# Patient Record
Sex: Female | Born: 1985 | Hispanic: Yes | Smoking: Never smoker
Health system: Southern US, Community
[De-identification: ages and names within clinical notes are randomized; demographics above are authoritative.]

## PROBLEM LIST (undated history)

## (undated) HISTORY — PX: BREAST BIOPSY: SHX20

## (undated) HISTORY — PX: APPENDECTOMY: SHX54

---

## 2019-10-11 NOTE — L&D Delivery Note (Signed)
Delivery Note Pt progressed without augmentation to C/C/+3.  After a 10 minute 2nd stage, at 9:53 PM a viable female was delivered via Vaginal, Spontaneous (Presentation: Left Occiput Anterior).  APGAR: 8, 9; weight pending.  After 1 minute, the cord was clamped and cut. 40 units of pitocin diluted in 1000cc LR was infused rapidly IV.  The placenta separated spontaneously and delivered via CCT and maternal pushing effort.  It was inspected and appears to be intact with a 3 VC  Anesthesia: Epidural Episiotomy: None Lacerations: 1st degree Suture Repair: 2.0 vicryl   (oozing) Est. Blood Loss (mL): 100  Mom to postpartum.  Baby to Couplet care / Skin to Skin.  Joaquim Lai Cresenzo-Dishmon 10/05/2020, 10:50 PM

## 2020-06-23 ENCOUNTER — Encounter: Payer: Self-pay | Admitting: *Deleted

## 2020-07-07 ENCOUNTER — Ambulatory Visit (INDEPENDENT_AMBULATORY_CARE_PROVIDER_SITE_OTHER): Payer: Self-pay | Admitting: Certified Nurse Midwife

## 2020-07-07 ENCOUNTER — Encounter: Payer: Self-pay | Admitting: *Deleted

## 2020-07-07 ENCOUNTER — Encounter: Payer: Self-pay | Admitting: Certified Nurse Midwife

## 2020-07-07 ENCOUNTER — Other Ambulatory Visit: Payer: Self-pay

## 2020-07-07 VITALS — BP 115/80 | HR 96 | Ht 62.2 in | Wt 164.3 lb

## 2020-07-07 DIAGNOSIS — O99612 Diseases of the digestive system complicating pregnancy, second trimester: Secondary | ICD-10-CM

## 2020-07-07 DIAGNOSIS — Z789 Other specified health status: Secondary | ICD-10-CM

## 2020-07-07 DIAGNOSIS — O0930 Supervision of pregnancy with insufficient antenatal care, unspecified trimester: Secondary | ICD-10-CM | POA: Insufficient documentation

## 2020-07-07 DIAGNOSIS — Z3490 Encounter for supervision of normal pregnancy, unspecified, unspecified trimester: Secondary | ICD-10-CM | POA: Insufficient documentation

## 2020-07-07 DIAGNOSIS — K219 Gastro-esophageal reflux disease without esophagitis: Secondary | ICD-10-CM

## 2020-07-07 DIAGNOSIS — Z3A25 25 weeks gestation of pregnancy: Secondary | ICD-10-CM

## 2020-07-07 LAB — POCT URINALYSIS DIP (DEVICE)
Bilirubin Urine: NEGATIVE
Glucose, UA: NEGATIVE mg/dL
Hgb urine dipstick: NEGATIVE
Ketones, ur: NEGATIVE mg/dL
Leukocytes,Ua: NEGATIVE
Nitrite: NEGATIVE
Protein, ur: NEGATIVE mg/dL
Specific Gravity, Urine: <=1.005 (ref 1.005–1.030)
Urobilinogen, UA: 0.2 mg/dL (ref 0.0–1.0)
pH: 5.5 (ref 5.0–8.0)

## 2020-07-07 MED ORDER — FAMOTIDINE 20 MG PO TABS: 20.0000 mg | ORAL_TABLET | Freq: Two times a day (BID) | ORAL | 3 refills | Status: DC

## 2020-07-07 NOTE — Progress Notes (Signed)
Here for new ob visit with [employer] interpreter. Given new patient prenatal booklets. Given bp cuff with insructions.  Crucita Lacorte,RN

## 2020-07-07 NOTE — Progress Notes (Signed)
   PRENATAL VISIT NOTE  Subjective:  Veronica Chapman is a 34 y.o. G2P1000 at [redacted]w[redacted]d being seen today for her first prenatal visit for this pregnancy.  She is currently monitored for the following issues for this low-risk pregnancy and has Supervision of low-risk pregnancy; Late prenatal care affecting pregnancy; and Language barrier on their problem list.  Patient reports heartburn.  Contractions: Not present. Vag. Bleeding: None.  Movement: Present. Denies leaking of fluid.   She is planning to both breast and bottle feed. She did so with her last child for two years. Desires Depo-Provera for contraception.   The following portions of the patient's history were reviewed and updated as appropriate: allergies, current medications, past family history, past medical history, past social history, past surgical history and problem list.   Objective:   Vitals:   07/07/20 1420 07/07/20 1423  BP: 115/80   Pulse: 96   Weight: 164 lb 4.8 oz (74.5 kg)   Height:  5' 2.2" (1.58 m)    Fetal Status: Fetal Heart Rate (bpm): 152 Fundal Height: 25 cm Movement: Present     General:  Alert, oriented and cooperative. Patient is in no acute distress.  Skin: Skin is warm and dry. No rash noted.   Cardiovascular: Normal heart rate and rhythm noted  Respiratory: Normal respiratory effort, no problems with respiration noted. Clear to auscultation.   Abdomen: Soft, gravid, appropriate for gestational age. Normal bowel sounds. Non-tender. Pain/Pressure: Absent     Pelvic: Cervical exam deferred       Normal cervical contour, no lesions, no bleeding following pap, normal discharge  Extremities: Normal range of motion.  Edema: None  Mental Status: Normal mood and affect. Normal behavior. Normal judgment and thought content.   Assessment and Plan:  Pregnancy: G2P1000 at [redacted]w[redacted]d 1. Encounter for supervision of low-risk pregnancy, antepartum - CBC/D/Plt+RPR+Rh+ABO+Rub Ab... - Culture, OB Urine - Hemoglobin  A1c - Genetic Screening - Last pap in [place], Sept 2019, was normal - No vaginal discharge or discomfort, no contractions so swabs deferred until 36 weeks.  2. Late prenatal care affecting pregnancy, antepartum - [employer], [facility] - Discussed the normal visit cadence for prenatal care - Discussed the nature of our practice with multiple providers including residents and students   3. Language barrier - [employer] interpreter present for appointment  4. 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 Scar did not trouble her during her last pregnancy. - Reviewed ways to ease discomfort if it begins to pull or tingle as her abdomen stretches  5. Gastroesophageal reflux during pregnancy in second trimester, antepartum - famotidine (PEPCID) 20 MG tablet; Take 1 tablet (20 mg total) by mouth 2 (two) times daily.  Dispense: 60 tablet; Refill: 3  Preterm labor/first trimester warning symptoms and general obstetric precautions including but not limited to vaginal bleeding, contractions, leaking of fluid and fetal movement were reviewed in detail with the patient. Please refer to After Visit Summary for other counseling recommendations.   Return today (on 07/07/2020) for LOB w GTT.  Gaylan Gerold, CNM, MSN, [facility] 07/07/20 3:49 PM

## 2020-07-09 LAB — CBC/D/PLT+RPR+RH+ABO+RUB AB...
Antibody Screen: NEGATIVE
Basophils Absolute: 0 10*3/uL (ref 0.0–0.2)
Basos: 0 %
EOS (ABSOLUTE): 0 10*3/uL (ref 0.0–0.4)
Eos: 0 %
HCV Ab: <0.1 s/co ratio (ref 0.0–0.9)
HIV Screen 4th Generation wRfx: NONREACTIVE
Hematocrit: 38.1 % (ref 34.0–46.6)
Hemoglobin: 12.5 g/dL (ref 11.1–15.9)
Hepatitis B Surface Ag: NEGATIVE
Immature Grans (Abs): 0.1 10*3/uL (ref 0.0–0.1)
Immature Granulocytes: 1 %
Lymphocytes Absolute: 2 10*3/uL (ref 0.7–3.1)
Lymphs: 21 %
MCH: 26.1 pg — ABNORMAL LOW (ref 26.6–33.0)
MCHC: 32.8 g/dL (ref 31.5–35.7)
MCV: 80 fL (ref 79–97)
Monocytes Absolute: 0.7 10*3/uL (ref 0.1–0.9)
Monocytes: 7 %
Neutrophils Absolute: 7 10*3/uL (ref 1.4–7.0)
Neutrophils: 71 %
Platelets: 318 10*3/uL (ref 150–450)
RBC: 4.79 x10E6/uL (ref 3.77–5.28)
RDW: 13.3 % (ref 11.7–15.4)
RPR Ser Ql: NONREACTIVE
Rh Factor: POSITIVE
Rubella Antibodies, IGG: 1.24 index (ref >0.99)
WBC: 9.8 10*3/uL (ref 3.4–10.8)

## 2020-07-09 LAB — HEMOGLOBIN A1C
Est. average glucose Bld gHb Est-mCnc: 100 mg/dL
Hgb A1c MFr Bld: 5.1 % (ref 4.8–5.6)

## 2020-07-09 LAB — HCV INTERPRETATION

## 2020-07-09 LAB — CULTURE, OB URINE

## 2020-07-09 LAB — URINE CULTURE, OB REFLEX

## 2020-07-17 ENCOUNTER — Other Ambulatory Visit: Payer: Self-pay | Admitting: General Practice

## 2020-07-17 DIAGNOSIS — Z3493 Encounter for supervision of normal pregnancy, unspecified, third trimester: Secondary | ICD-10-CM

## 2020-07-21 ENCOUNTER — Ambulatory Visit (INDEPENDENT_AMBULATORY_CARE_PROVIDER_SITE_OTHER): Payer: Self-pay | Admitting: Advanced Practice Midwife

## 2020-07-21 ENCOUNTER — Other Ambulatory Visit: Payer: Self-pay

## 2020-07-21 ENCOUNTER — Encounter: Payer: Self-pay | Admitting: Advanced Practice Midwife

## 2020-07-21 VITALS — BP 118/75 | HR 85 | Wt 165.1 lb

## 2020-07-21 DIAGNOSIS — Z23 Encounter for immunization: Secondary | ICD-10-CM

## 2020-07-21 DIAGNOSIS — Z3A27 27 weeks gestation of pregnancy: Secondary | ICD-10-CM

## 2020-07-21 DIAGNOSIS — Z3493 Encounter for supervision of normal pregnancy, unspecified, third trimester: Secondary | ICD-10-CM

## 2020-07-21 DIAGNOSIS — Z3492 Encounter for supervision of normal pregnancy, unspecified, second trimester: Secondary | ICD-10-CM

## 2020-07-21 NOTE — Progress Notes (Signed)
   PRENATAL VISIT NOTE  Subjective:  Veronica Chapman is a 34 y.o. G2P1001 at [redacted]w[redacted]d being seen today for ongoing prenatal care.  She is currently monitored for the following issues for this low-risk pregnancy and has Supervision of low-risk pregnancy; Late prenatal care affecting pregnancy; and Language barrier on their problem list.  Patient reports no complaints.  Contractions: Not present. Vag. Bleeding: None.  Movement: Present. Denies leaking of fluid.   The following portions of the patient's history were reviewed and updated as appropriate: allergies, current medications, past family history, past medical history, past social history, past surgical history and problem list.   Objective:   Vitals:   07/21/20 0840  BP: 118/75  Pulse: 85  Weight: 165 lb 1.6 oz (74.9 kg)    Fetal Status: Fetal Heart Rate (bpm): 154 Fundal Height: 27 cm Movement: Present     General:  Alert, oriented and cooperative. Patient is in no acute distress.  Skin: Skin is warm and dry. No rash noted.   Cardiovascular: Normal heart rate noted  Respiratory: Normal respiratory effort, no problems with respiration noted  Abdomen: Soft, gravid, appropriate for gestational age.  Pain/Pressure: Absent     Pelvic: Cervical exam deferred        Extremities: Normal range of motion.  Edema: None  Mental Status: Normal mood and affect. Normal behavior. Normal judgment and thought content.   Assessment and Plan:  Pregnancy: G2P1001 at [redacted]w[redacted]d 1. Encounter for supervision of low-risk pregnancy in second trimester - Patient's ultrasound was rescheduled to 07/23/2020 - Recommended Covid vaccine   2. [redacted] weeks gestation of pregnancy - GTT today - tdap today    Preterm labor symptoms and general obstetric precautions including but not limited to vaginal bleeding, contractions, leaking of fluid and fetal movement were reviewed in detail with the patient. Please refer to After Visit Summary for other counseling  recommendations.   Return in about 2 weeks (around 08/04/2020).  No future appointments.   Marcille Buffy DNP, CNM  07/21/20  9:16 AM

## 2020-07-22 LAB — GLUCOSE TOLERANCE, 2 HOURS W/ 1HR
Glucose, 1 hour: 134 mg/dL (ref 65–179)
Glucose, 2 hour: 94 mg/dL (ref 65–152)
Glucose, Fasting: 83 mg/dL (ref 65–91)

## 2020-07-29 ENCOUNTER — Encounter: Payer: Self-pay | Admitting: *Deleted

## 2020-08-05 ENCOUNTER — Other Ambulatory Visit: Payer: Self-pay

## 2020-08-05 ENCOUNTER — Ambulatory Visit (INDEPENDENT_AMBULATORY_CARE_PROVIDER_SITE_OTHER): Payer: Self-pay | Admitting: Nurse Practitioner

## 2020-08-05 VITALS — BP 112/64 | HR 84 | Wt 166.7 lb

## 2020-08-05 DIAGNOSIS — Z3493 Encounter for supervision of normal pregnancy, unspecified, third trimester: Secondary | ICD-10-CM

## 2020-08-05 DIAGNOSIS — Z3A29 29 weeks gestation of pregnancy: Secondary | ICD-10-CM

## 2020-08-05 NOTE — Progress Notes (Signed)
    Subjective:  Veronica Chapman is a 34 y.o. G2P1001 at [redacted]w[redacted]d being seen today for ongoing prenatal care.  She is currently monitored for the following issues for this low-risk pregnancy and has Supervision of low-risk pregnancy; Late prenatal care affecting pregnancy; and Language barrier on their problem list.  Patient reports some abdominal pain at night - infrequent.  Contractions: Not present. Vag. Bleeding: None.  Movement: Present. Denies leaking of fluid.   The following portions of the patient's history were reviewed and updated as appropriate: allergies, current medications, past family history, past medical history, past social history, past surgical history and problem list. Problem list updated.  Objective:   Vitals:   08/05/20 1602  BP: 112/64  Pulse: 84  Weight: 166 lb 11.2 oz (75.6 kg)    Fetal Status: Fetal Heart Rate (bpm): 145 Fundal Height: 29 cm Movement: Present     General:  Alert, oriented and cooperative. Patient is in no acute distress.  Skin: Skin is warm and dry. No rash noted.   Cardiovascular: Normal heart rate noted  Respiratory: Normal respiratory effort, no problems with respiration noted  Abdomen: Soft, gravid, appropriate for gestational age. Pain/Pressure: Present     Pelvic:  Cervical exam deferred        Extremities: Normal range of motion.  Edema: None  Mental Status: Normal mood and affect. Normal behavior. Normal judgment and thought content.   Urinalysis:      Assessment and Plan:  Pregnancy: G2P1001 at [redacted]w[redacted]d  1. Encounter for supervision of low-risk pregnancy in third trimester Drink at least 8 8-oz glasses of water every day. Let clinic know if abdominal cramping is worsening but currently is feeling braxton hicks contractions much less than 4 times in an hour.  Has a large scar on her abdomen from appendectomy and could also be having stretching of scar tissue. Seen in [employer] today for food. Encouraged to consider  getting covid vaccine Given list of pediatric providers and advised to call one to establish care for her child who is now with her here and for the new baby coming.  2. [redacted] weeks gestation of pregnancy   Preterm labor symptoms and general obstetric precautions including but not limited to vaginal bleeding, contractions, leaking of fluid and fetal movement were reviewed in detail with the patient. Please refer to After Visit Summary for other counseling recommendations.  Return in about 2 weeks (around 08/19/2020) for in person ROB.  Earlie Server, RN, MSN, NP-BC Nurse Practitioner, [facility] for [employer], [place] [facility] 08/05/2020 4:27 PM

## 2020-08-05 NOTE — Patient Instructions (Signed)
AREA PEDIATRIC/FAMILY PRACTICE PHYSICIANS  [place] ([address]) . [facility] o Chambliss, MD; Eniola, MD; Hale, MD; Hensel, MD; McDiarmid, MD; McIntyer, MD; Neal, MD; Walden, MD o [address] [place], [place] [address] o [phone] o Mon-Fri 8:30-12:30, 1:30-5:00 o Providers come to see babies at [facility] o Accepting Medicaid . [facility] o Limited providers who accept newborns: Koirala, MD; Morrow, MD; Wolters, MD o [address], [place], [place] [address] o [phone] o Mon-Fri 8:00-5:30 o Babies seen by providers at [facility] o Does NOT accept Medicaid o Please call early in hospitalization for appointment (limited availability)  . [facility] o Mulberry, MD o [address] [place], [place] [address] o [phone] o Mon, Tue, Thur, Fri 8:30-5:00, Wed 10:00-7:00 (closed 1-2pm) o Babies seen by [facility] providers o Accepting Medicaid . [facility] o Rubin, MD o [address], [place], [place] [address] o [phone] o Mon-Fri 8:30-5:00, Sat 8:30-12:00 o Provider comes to see babies at [facility] o Accepting Medicaid o Must have been referred from current patients or contacted office prior to delivery . [facility] ([facility]) o Brown, MD; Chandler, MD; Ettefagh, MD; Grant, MD; Lester, MD; McCormick, MD; McQueen, MD; Prose, MD; Simha, MD; Stanley, MD; Stryffeler, NP; Tebben, NP o [address], [place], [place] [address] o [phone] o Mon, Tue, Thur, Fri 8:30-5:30, Wed 9:30-5:30, Sat 8:30-12:30 o Babies seen by [facility] providers o Accepting Medicaid o Only accepting infants of first-time parents or siblings of current patients o Hospital discharge coordinator will make follow-up appointment . Jack Amos o [address],  [place], [place]  [address] o [phone]   Fax - [phone] . [facility] o [address], [place], [place]  [address] o Phone - [phone]   Fax - [phone] . Shilpa Gosrani o [address], [place], [place]  [address] o [phone]  [place] ([address]) . [facility] o Bates, MD; Brassfield, MD; Cooper, Cox, MD; MD; Davis, MD; Dovico, MD; Ettefaugh, MD; Little, MD; Lowe, MD; Keiffer, MD; Melvin, MD; Sumner, MD; Williams, MD o [address], [place], [place] [address] o [phone] o Mon-Fri 8:30-5:00 (extended evenings Mon-Thur as needed), Sat-Sun 10:00-1:00 o Providers come to see babies at [facility] o Accepting Medicaid for families of first-time babies and families with all children in the household age 3 and under. Must register with office prior to making appointment (M-F only). . [facility] o Henson, NP; Knapp, MD; Lalonde, MD; Tysinger, PA o [address] [place], [place] [address] o [phone] o Mon-Fri 8:00-5:00 o Babies seen by providers at [facility] o Does NOT accept Medicaid/Commercial Insurance Only . [facility] ([facility])  o Artis, MD; Barnes, MD; Bratton, MD; Coccaro, MD; Lockett Gardner, MD; Kramer, MD; Marshall, MD; Netherton, MD; Poleto, MD; Skinner, MD o [address] [place], [place] [address] o [phone] o Mon-Fri 8:30-5:30, Sat (Oct.-Mar.) 9:00-1:00 o Babies seen by providers at [facility] o Accepting Medicaid  [place] ([address]) . [facility] o Reid, MD; Warner, MD o [address], ,  [address] o [phone] o Mon-Fri 8:30-5:00, Sat 8:30-12:00 o Providers come to see babies at [facility] o Does NOT accept Medicaid . [facility] o Becker, PA; Hagler, MD; Scifres, PA; Sun, MD; Swayne, MD o [address],  [place], [place] [address] o [phone] o Mon-Fri 8:00-5:00 o Babies seen by providers at [facility] o Does NOT accept Medicaid o Only accepting babies of parents who are patients o Please call early in hospitalization for appointment (limited availability) . [facility] o Clark, MD; Frye, MD; Kelleher, MD; Mack, NP; Miller, MD; O'Keller, MD; Patterson, NP; Pudlo, MD; Puzio, MD; Thomas, MD; Tucker, MD; Twiselton, MD o [address], [place], [place] [address] o [phone] o Mon-Fri 8:00-5:00, Sat 9:00-12:00 o Providers come to see babies at [facility] o Does NOT accept Medicaid  [place] ([address]) . [facility] o Limited providers accepting new patients: Brake, NP; Wharton, PA o [address], [place], [place] [address] o [phone] o Mon-Fri 8:00-5:00 o Babies seen by providers at [facility] o Does NOT accept Medicaid o Only accepting babies of parents who are patients o Please call early in hospitalization for appointment (limited availability) . [facility] o Gay, MD; Quinlan, MD o [address] [place], [place] [address] o [phone] (press 1 to schedule appointment) o Mon-Fri 8:00-5:00 o Providers come to see babies at [facility] o Does NOT accept Medicaid . [facility] o Mazer, MD o [address] [place], [place] [address] o [phone] o Mon-Fri 8:30-5:00 (lunch 12:30-1:00), extended hours by appointment only Wed 5:00-6:30 o Babies seen by [facility] providers o Accepting Medicaid . [facility] o Banks, MD; Jordan, MD; Koberlein, MD o [address], [place], [place] [address] o [phone] o Mon-Fri 8:00-5:00 o Babies seen by [facility] providers o Does NOT accept Medicaid . [facility] o Parker, MD; Hunter, MD; Wallace, DO o [address] [place], [place]  [address] o [phone] o Mon-Fri 8:00-5:00 o Babies seen by [facility] providers o Does NOT accept Medicaid . [facility] o Brandon, PA; Brecken, PA; Christy, NP; Dees, MD; DeClaire, MD; DeWeese, MD; Hansen, NP; Mills, NP; Parrish, NP; Smoot, NP; Summer, MD; Vapne, MD o [address] [place], [place] [address] o [phone] o Mon-Fri 8:30-5:00, Sat 10:00-1:00 o Providers come to see babies at [facility] o Does NOT accept Medicaid o Free prenatal information session Tuesdays at 4:45pm . [facility] o Bouska, MD; Gordon, PA; Jeffery, PA; Weber, PA o [address] [place] [address] o [phone] o Mon-Fri 7:30-5:30 o Babies seen by [facility] providers . [facility] o [address], [place], [place]  [address] o [phone]   Fax - [phone]  [place] ([address]) . [facility] o Reese, MD o [address] [place], [place] [address] o [phone] o Mon-Thur 8:00-6:00 o Providers come to see babies at [facility] o Accepting Medicaid . [facility] o Anderson, NP; Badger, MD; Beal, PA; Spencer, PA o [address] [place], [place] [address] o [phone] o Mon-Thur 7:30-7:30, Fri 7:30-4:30 o Babies seen by [facility] providers o Accepting Medicaid . [facility] o Agbuya, MD; Klett, NP; Romgoolam, MD o [address], [place], [place] [address] o [phone] o Mon-Fri 8:30-5:00, Sat 8:30-12:00 o Providers come to see babies at [facility] o Accepting Medicaid o Must have "Meet & Greet" appointment at office prior to delivery . [facility] ([facility] o McCord,   MD; Wallace, MD; Wood, MD o [address], [place], [place] [address] o [phone] o Mon-Wed 8:00-6:00, Thur-Fri 8:00-5:00, Sat 9:00-12:00 o Providers come to  see babies at [facility] o Does NOT accept Medicaid o Only accepting siblings of current patients . [facility]  o [address], [place], [place]  [address] o [phone]   Fax - [phone] . [facility] o [address], [place], [place]  [address] o [phone]   Fax - [phone]  [place] ([address] & [address]) . [facility] o Cirigliano, DO; Matthews, DO o [address] [place], [place] [address] o [phone] o Mon-Fri 7:00-5:00 o Babies seen by [facility] providers o Does NOT accept Medicaid . [facility] o Briscoe, MD; Howley, PA; Moreira, PA o [address], [place], [place] [address] o [phone] o Mon-Fri 8:00-5:00 o Babies seen by [facility] providers o Accepting Medicaid . [facility] o Boyd, MD; Church, PA; Jones, NP; Osborn, PA o [address], [place], [place] [address] o [phone] o Mon-Fri 8:00-5:00 o Babies seen by providers at [facility] o Accepting Medicaid  [place] ([address]) . [facility] [place] o Wendling, DO o [address] [place], [place] [address] o [phone] o Mon-Fri 8:00-5:00 o Babies seen by [facility] providers o Does NOT accept Medicaid o Limited availability, please call early in hospitalization to schedule follow-up . [facility] o Calderon, PA; Cummings, MD; Dillard, MD; Martin, PA; Olson, MD; VanDeven, PA o [address], [place], [place] [address] o [phone] o Mon-Fri 8:30-5:00, Sat 9:00-12:00 o Babies seen by providers at [facility] o Accepting Medicaid o Please register online then schedule online or call office o [email or web address] . [facility] ([facility] o Hunter, NP; Kumar, MD; Martin Rogers, PA o [address], [place], [place] [address] o [phone] o Mon-Fri 8:00-5:00 o Babies seen by providers at [facility] o Accepting Medicaid . [facility] ([facility] o Tierr[place], MD; Kristi Fleenor, NP; West, MD o [address], [place], [place] [address] o [phone] o Mon-Fri 8:00-5:30, Sat&Sun by appointment (phones open at 8:30) o Babies seen by [facility] providers o Accepting Medicaid o Must be a first-time baby or sibling of current patient . [facility] [place]  o [address], [place], [place]  [address] o [phone]   Fax - [phone]  [place] ([address]) . [facility] o Brown, PA; Cowen, PA; Rice, MD; Helton, PA; Spry, MD o [address] [place], [place] [address] o [phone] o Mon-Thur 8:00-7:00, Fri 8:00-5:00, Sat 8:00-12:00, Sun 9:00-12:00 o Babies seen by [facility] providers o Accepting Medicaid . [facility] o Coe-Goins, MD; Marshall, MD; Pierre-Louis, MD o [address] [place] [address], [place], [place] [address] o [phone] o Mon-Thur 8:00-5:00 o Babies seen by providers at [facility] o Accepting Medicaid . [facility] o Bratton, MD; Coe-Goins, MD; Hayes, MD; Lewis, MD; List, MD; Lott, MD; Marshall, MD; Moran, MD; O'Neal, MD; Pierre-Louis, MD; Pitonzo, MD; Scholer, MD; Spangle, MD o [address] [place],    [address] o [phone] o Mon-Fri 8:00-5:30, Sat (Oct.-Mar.) 9:00-1:00 o Babies seen by providers at [facility] o Accepting Medicaid o Must fill out new patient packet, available online at [email or web address] . [facility] ([facility] o Friddle, NP; Harris, NP; Kelly, NP; Logan, MD;  Melvin, PA; Poth, MD; Ramadoss, MD; Stanton, NP o [address], [place], [place] [address] o [phone] o Mon-Thur 8:00-5:30, Fri 8:00-5:00 o Babies seen by providers at [facility] o Accepting Medicaid  [place] ([address]) . [facility] o Dixon, PA; Piermont, MD; Pickard, MD; Tapia, PA o [address], [place], [place] [address] o [phone] o Mon-Fri 8:00-5:00 o Babies seen by providers at [facility] o Accepting Medicaid   [place] ([address]) . [facility] [place] o Masneri, DO; Meyers, MD; Nelson, PA o [address], [place], [place] [address] o [phone] o Mon-Fri 8:00-5:00 o Babies seen by providers at [facility] o Does NOT accept Medicaid o Limited appointment availability, please call early in hospitalization  . [facility] [place] o Kunedd, DO; McGowen, MD o [address], [place], [place] [address] o [phone] o Mon-Fri 8:00-5:00 o Babies seen by [facility] providers o Does NOT accept Medicaid . [facility] [place] o Cameron, MD; MacDonald, MD; Michaels, PA; Nayak, MD o [address] [place] [address], [place], [place] [address] o [phone] o Mon-Fri 8:00-5:00 o After hours clinic ([address] [place], [place] [address]) [phone] Mon-Fri 5:00-8:00, Sat 12:00-6:00, Sun 10:00-4:00 o Babies seen by [facility] providers o Accepting Medicaid . [facility] [place] o [address], [place], [place]  [address] o [phone]   Fax - [phone]  [place] ([address]) . [facility] [place] o Andy, MD o [address], [place], [place] [address] o [phone] o Mon-Fri 8:00-5:00 o Babies seen by [facility] providers o Does NOT accept Medicaid . [facility] [place] ([facility] [place]) o Eksir, MD o [address], [place], [place]  [address] o [phone] o Mon-Thur 8:00-7:00, Fri 8:00-5:00, Sat 8:00-12:00 o Babies seen by providers at [facility] o Accepting Medicaid - but does not have vaccinations in office (must be received elsewhere) o Limited availability, please call early in hospitalization  [place] ([address]) . [facility]  o Charlene Flemming, MD o [address], [place]  [address] o [phone]  Fax [phone]   

## 2020-08-10 ENCOUNTER — Encounter: Payer: Self-pay | Admitting: *Deleted

## 2020-08-19 ENCOUNTER — Ambulatory Visit (INDEPENDENT_AMBULATORY_CARE_PROVIDER_SITE_OTHER): Payer: Self-pay | Admitting: Student

## 2020-08-19 ENCOUNTER — Other Ambulatory Visit: Payer: Self-pay

## 2020-08-19 VITALS — BP 113/81 | HR 94 | Wt 165.9 lb

## 2020-08-19 DIAGNOSIS — Z3A31 31 weeks gestation of pregnancy: Secondary | ICD-10-CM

## 2020-08-19 DIAGNOSIS — Z3493 Encounter for supervision of normal pregnancy, unspecified, third trimester: Secondary | ICD-10-CM

## 2020-08-19 NOTE — Progress Notes (Signed)
Patient ID: Veronica Chapman, female   DOB: July 28, 1986, 34 y.o.   MRN: [id number]   PRENATAL VISIT NOTE  Subjective:  Veronica Chapman is a 34 y.o. G2P1001 at [redacted]w[redacted]d being seen today for ongoing prenatal care.  She is currently monitored for the following issues for this low-risk pregnancy and has Supervision of low-risk pregnancy; Late prenatal care affecting pregnancy; and Language barrier on their problem list.  Patient reports fatigue.  Contractions: Not present. Vag. Bleeding: None.  Movement: Present. Denies leaking of fluid.   The following portions of the patient's history were reviewed and updated as appropriate: allergies, current medications, past family history, past medical history, past social history, past surgical history and problem list.   Objective:   Vitals:   08/19/20 1410  BP: 113/81  Pulse: 94  Weight: 165 lb 14.4 oz (75.3 kg)    Fetal Status: Fetal Heart Rate (bpm): 137 Fundal Height: 28 cm Movement: Present     General:  Alert, oriented and cooperative. Patient is in no acute distress.  Skin: Skin is warm and dry. No rash noted.   Cardiovascular: Normal heart rate noted  Respiratory: Normal respiratory effort, no problems with respiration noted  Abdomen: Soft, gravid, appropriate for gestational age.  Pain/Pressure: Absent     Pelvic: Cervical exam deferred        Extremities: Normal range of motion.  Edema: None  Mental Status: Normal mood and affect. Normal behavior. Normal judgment and thought content.   Assessment and Plan:  Pregnancy: G2P1001 at [redacted]w[redacted]d 1. Encounter for supervision of low-risk pregnancy in third trimester -Address given for hospital, explained what to expect when she goes to hospital (MAU, etc).  -list of pediatricians given -watch FH, have patient return in 2 weeks for in person visit  Preterm labor symptoms and general obstetric precautions including but not limited to vaginal bleeding, contractions, leaking of fluid and  fetal movement were reviewed in detail with the patient. Please refer to After Visit Summary for other counseling recommendations.   Return in about 2 weeks (around 09/02/2020), or LROB with Reader.  Future Appointments  Date Time Provider [place]  09/09/2020  2:15 PM Starr Lake, CNM [facility]    Mervyn Skeeters [place], [place]

## 2020-08-19 NOTE — Patient Instructions (Addendum)
[address], [place], [place] [address] Hours:  Open 24 hours  AREA PEDIATRIC/FAMILY [place] [phone]) . [facility] Davy Pique, MD; Gwendlyn Deutscher, MD; Walker Kehr, MD; Andria Frames, MD; McDiarmid, MD; Dutch Quint, MD; Nori Riis, MD; Mingo Amber, [address]., [place], [place] [address] o [phone] o Mon-Fri 8:30-12:30, 1:30-5:00 o Providers come to see babies at [facility] o Accepting Medicaid . [place] at [place] providers who accept newborns: Dorthy Cooler, MD; Orland Mustard, MD; Stephanie Acre, MD o [place], [place], [place] [address] o [phone] o Mon-Fri 8:00-5:30 o Babies seen by providers at [facility] o Does NOT accept Medicaid o Please call early in hospitalization for appointment (limited availability)  . Mustard [place], MD o [address], [place], [place] [address] o [phone] o Mon, Tue, Thur, Fri 8:30-5:00, Wed 10:00-7:00 (closed 1-2pm) o Babies seen by [facility] providers o Accepting Medicaid . [place], MD o [place], [place], [place] [address] o [phone] o Mon-Fri 8:30-5:00, Sat 8:30-12:00 o Provider comes to see babies at [place] Medicaid o Must have been referred from current patients or contacted office prior to delivery . [place] [facility] ([place] for [place]) Franne Forts, MD; Tamera Punt, MD; Doneen Poisson, MD; Fatima Sanger, MD; Wynetta Emery, MD; Jess Barters, MD; Tami Ribas, MD; Herbert Moors, MD; Derrell Lolling, MD; Dorothyann Peng, MD; Lucious Groves, NP; Baldo Ash, NP o [place] [address], [place], [place] [address] o [phone] o Mon, Tue, Thur, Fri 8:30-5:30, Wed 9:30-5:30, Sat 8:30-12:30 o Babies seen by [facility] providers o Accepting Medicaid o Only accepting infants of first-time parents or siblings of current patients [facility]  discharge coordinator will make follow-up appointment . Baltazar Najjar o [place] [address], [place], [place]  [address] o [phone]   Fax - [phone] . [facility] o [address], [place], [place]  [address] o Phone - [phone]   Fax [phone] . [place], [place], [place], [place]  [address] o [phone]  [place] [phone]) . [facility] Reginal Lutes, MD; Jacklynn Ganong, MD; Torrie Mayers, MD; MD; Rosana Hoes, MD; Servando Salina, MD; Rose Fillers, MD; Rex Kras, MD; Corinna Capra, MD; Volney American, MD; Trilby Drummer, MD; Janann Colonel, MD; Jimmye Norman, [place], [place], [place] [address] o [phone] o Mon-Fri 8:30-5:00 (extended evenings Mon-Thur as needed), Sat-Sun 10:00-1:00 o Providers come to see babies at [place] Medicaid for families of first-time babies and families with all children in the household age 54 and under. Must register with office prior to making appointment (M-F only). . [place], NP; Tomi Bamberger, MD; Redmond School, MD; [place], [place] [address] [place], [place] [address] o [phone] o Mon-Fri 8:00-5:00 o Babies seen by providers at [facility] o Does NOT accept Medicaid/Commercial Insurance Only . [facility] [place] ([facility])  Marnee Guarneri, MD; Drema Dallas, MD; Montine Circle, MD; Vilma Prader, MD; Vanita Panda, MD; Alfonso Ramus, MD; Ruthann Cancer, MD; Roxanne Mins, MD; Rosalva Ferron, MD; Polly Cobia, MD o [place]., [place], [place] [address] o [phone] o Mon-Fri 8:30-5:30, Sat (Oct.-Mar.) 9:00-1:00 o Babies seen by providers at [place] [phone]) . [facility] Peers, MD; Suzan Slick, MD o [address], [place], [place] [address] o [phone] o Mon-Fri 8:30-5:00, Sat 8:30-12:00 o Providers come to  see babies at [facility] o Does NOT accept Medicaid . [place] at  [place], [place]; [place], MD; [place], [place]; Nancy Fetter, MD; Moreen Fowler, [place], [place], [place] [address] o [phone] o Mon-Fri 8:00-5:00 o Babies seen by providers at [facility] o Does NOT accept Medicaid o Only accepting babies of parents who are patients o Please call early in hospitalization for appointment (limited availability) . [facility] Blanca Friend, MD; Sharlene Motts, MD; Rod Can, MD; Warner Mccreedy, NP; Sabra Heck, MD; Ermalinda Memos, MD; Sharlett Iles, NP; Aurther Loft, MD; Jerrye Beavers, MD; Marcello Moores, MD; Berline Lopes, MD; Charolette Forward, MD o [place]. [place], [place], [place] [address] o [phone] o Mon-Fri 8:00-5:00, Sat 9:00-12:00 o Providers come to see babies at [facility] o Does NOT accept [facility] [phone]) . [place] at [place] providers accepting new patients: Dayna Ramus, NP; [place], [place], [place], [place] [address] o [phone] o Mon-Fri 8:00-5:00 o Babies seen by providers at [facility] o Does NOT accept Medicaid o Only accepting babies of parents who are patients o Please call early in hospitalization for appointment (limited availability) . [facility] Oswaldo Conroy, MD; Sheran Lawless, MD o [place]., [place], [place] [address] o [phone] (press 1 to schedule appointment) o Mon-Fri 8:00-5:00 o Providers come to see babies at [facility] o Does NOT accept Medicaid . [facility] Jodi Mourning, MD o [address], [place], [place] [address] o [phone] o Mon-Fri 8:30-5:00 (lunch 12:30-1:00), extended hours by appointment only Wed 5:00-6:30 o Babies seen by [facility] providers o Accepting Medicaid . [place] at Evalyn Casco, MD; [place], MD; Ethlyn Gallery, MD o [place], [place], [place] [address] o [phone] o Mon-Fri 8:00-5:00 o Babies seen by [facility] providers o Does NOT accept Medicaid . Therapist, music at [place], MD; Yong Channel, MD; [place], [address]., [place], [place] [address] o [phone] o Mon-Fri 8:00-5:00 o Babies seen by [facility] providers o Does NOT accept Medicaid . [place], [place]; [place], [place]; Banner, NP; Albertina Parr, MD; Frederic Jericho, MD; Ronney Lion, MD; Carlos Levering, NP; Jerelene [place], NP; Tomasita Crumble, NP; Ronelle Nigh, NP; Corinna Lines, MD; [place], MD o [place]., [place], [place] [address] o [phone] o Mon-Fri 8:30-5:00, Sat 10:00-1:00 o Providers come to see babies at [facility] o Does NOT accept Medicaid o Free prenatal information session Tuesdays at 4:45pm . [facility] Porfirio Oar, MD; [place], [place]; [place], [place]; [place], [place]., [place] [address] o [phone] o Mon-Fri 7:30-5:30 o Babies seen by [facility] providers . [facility] o [address], [place], [place], [place]  [address] o [phone]   Fax - [phone]  [place] [phone] & [phone]) . [place], MD o [address] [place], [place] [address] o [phone] o Mon-Thur 8:00-6:00 o Providers come to see babies at [place] Medicaid . [place], NP; Melford Aase, MD; [place], [place]; [place], [place]., [place], [place] [address] o [phone] o Mon-Thur 7:30-7:30, Fri 7:30-4:30 o Babies seen by [facility] providers o Accepting Medicaid . [facility] Nyra Jabs, MD; Cristino Martes, NP; Gertie Baron, MD o [place] [address], [place],  [address] o [phone] o Mon-Fri 8:30-5:00, Sat 8:30-12:00 o Providers come to see babies at [place] Medicaid o Must have "Meet & Greet" appointment at office  prior to delivery . [place] ([place]) Jodene Nam, MD; Juleen China, MD; Clydene Laming, [place] [address], [place], [place]  [address] o [phone] o Mon-Wed 8:00-6:00, Thur-Fri 8:00-5:00, Sat 9:00-12:00 o Providers come to see babies at [facility] o Does NOT accept Medicaid o Only accepting siblings of current patients . [facility] [place], [place], [place], [place]  [address] o [phone]   Fax [phone] . [place] at [place] [address] [place], [place]  [address] o [phone]   Fax - [place] [phone] & [phone]) . Therapist, music at [place], DO; [place], [place]., [place], [place] [employer] [address] o [phone] o Mon-Fri 7:00-5:00 o Babies seen by [facility] providers o Does NOT accept Medicaid . [place], MD; [place], [place]; [place], [place], [place], [place] [address] o [phone] o Mon-Fri 8:00-5:00 o Babies seen by [facility] providers o Accepting Medicaid . [place], MD; [place], [place]; [place], NP; [place], [place], [place], [place] [address] o [phone] o Mon-Fri 8:00-5:00 o Babies seen by providers at [place] [phone]) . [facility] [place], [place] o [place]., [place], [place] [address] o [phone] o Mon-Fri 8:00-5:00 o Babies seen by [facility] providers o Does NOT accept Medicaid o Limited availability, please call early in hospitalization to schedule follow-up . [facility] Leilani Merl, PA; Maisie Fus, MD; [place], MD; [place], [place]; Jeannine Kitten, MD; [place], [place] [facility] [address], [place], [place] [address] o [phone] o Mon-Fri 8:30-5:00, Sat 9:00-12:00 o Babies seen by providers at [facility] o Accepting Medicaid o Please register online then schedule online or call  office o [email or web address] . [place] ([place] at [place]) Kristian Covey, NP; Dwyane Dee, MD; Leonidas Romberg, PA o [address] [place], [place], [place] [address] o [phone] o Mon-Fri 8:00-5:00 o Babies seen by providers at [facility] o Accepting Medicaid . [place] ([facility] [employer]) Dairl Ponder, MD; Rayvon Char, NP; Melina Modena, MD o [address] [place], [place], [place] [address] o [phone] o Mon-Fri 8:00-5:30, Sat&Sun by appointment (phones open at 8:30) o Babies seen by [facility] providers o Accepting Medicaid o Must be a first-time baby or sibling of current patient . [place], [address], [place], [place]  [address] o [phone]   Fax - [phone]  [place] [phone] & [phone]) . [place], [place]; [place], [place]; Benjamine Mola, MD; [place], [place]; Harrell Lark, MD o [address], [place], [place] [address] o [phone] o Mon-Thur 8:00-7:00, Fri 8:00-5:00, Sat 8:00-12:00, Sun 9:00-12:00 o Babies seen by [facility] providers o Accepting Medicaid . [facility] at [facility], MD; Ruthann Cancer, MD; [facility], MD o [address], [place],  [address] o [phone] o Mon-Thur 8:00-5:00 o Babies seen by providers at [facility] o Accepting Medicaid . [facility] at [facility], MD; Coe[place], MD; Amedeo Plenty, MD; Bobby Rumpf, MD; List, MD; Lavonia Drafts, MD; Ruthann Cancer, MD; Selinda Eon, MD; Audie Box, MD; [place],  MD; Christie Nottingham, MD; Hubbard Hartshorn, MD; Modena Nunnery, MD o [place]., [place], [place] [address] o [phone] o Mon-Fri 8:00-5:30, Sat (Oct.-Mar.) 9:00-1:00 o Babies seen by providers at [facility] o Accepting Medicaid o Must fill out new patient packet, available online at [email or web address] . [place] ([place]) Barnabas Lister, NP; Kenton Kingfisher, NP; Claiborne Billings, NP; Rolla Plate, MD; [place], [place]; Carola Rhine, MD; Tyron Russell, MD; Delia Chimes, NP o [address], [place], [place] [address] o [phone] o Mon-Thur 8:00-5:30, Fri 8:00-5:00 o Babies seen by providers at [place] [phone]) . [place], [place]; [place], MD; Dennard Schaumann, MD; [place], [place] o [address] [place] [address] [place], [place] [address] o [phone] o Mon-Fri 8:00-5:00 o Babies seen by providers at [place] [phone]) . [place] at [place], [place]; Olen Pel, MD; [place], [place], [place], [place] [address] o [phone] o Mon-Fri 8:00-5:00 o Babies seen by providers at [facility] o Does NOT accept Medicaid o Limited appointment availability, please call early in hospitalization  . Therapist, music at [place], [place]; [place], [place] [address] [place], [place] [address] o [phone] o Mon-Fri 8:00-5:00 o Babies seen by [facility] providers o Does NOT accept Medicaid . [facility] Su Grand, MD; Guy Sandifer, MD; [place], [place]; [place], [place] [address], [place], [place] [address] o [phone] o Mon-Fri 8:00-5:00 o After hours clinic [facility] [place], [place] [address]) [phone] Mon-Fri 5:00-8:00, Sat 12:00-6:00, Sun 10:00-4:00 o Babies seen by [facility] providers o Accepting Medicaid . [place] at [facility] o [address] [place], [place]  [address] o [phone]   Fax - [phone]  [place] [phone]) . Therapist, music at [facility], MD o [address] [place], [place], [place] [address] o [phone] o Mon-Fri 8:00-5:00 o Babies seen by [facility] providers o Does NOT accept Medicaid . [place] ([place] at [place]) Bing Neighbors, MD o [address] [place], [place], [place] [address] o [phone] o Mon-Thur 8:00-7:00, Fri 8:00-5:00, Sat 8:00-12:00 o Babies seen by providers at [facility] o Accepting Medicaid - but does not have vaccinations in office (must be received elsewhere) o Limited availability, please call early in hospitalization  [place] ([phone]) . [place], [place] [address], [place] [address] o [phone]  Fax [phone]

## 2020-08-19 NOTE — Progress Notes (Signed)
Pt states has been tired a lot.

## 2020-09-09 ENCOUNTER — Encounter: Payer: Self-pay | Admitting: Student

## 2020-09-29 ENCOUNTER — Encounter: Payer: Self-pay | Admitting: Advanced Practice Midwife

## 2020-09-29 ENCOUNTER — Other Ambulatory Visit: Payer: Self-pay

## 2020-09-29 ENCOUNTER — Ambulatory Visit (INDEPENDENT_AMBULATORY_CARE_PROVIDER_SITE_OTHER): Payer: Self-pay | Admitting: Advanced Practice Midwife

## 2020-09-29 ENCOUNTER — Other Ambulatory Visit (HOSPITAL_COMMUNITY)
Admission: RE | Admit: 2020-09-29 | Discharge: 2020-09-29 | Disposition: A | Discharge status: Home / Self Care | Payer: Self-pay | Source: Ambulatory Visit | Attending: Student | Admitting: Student

## 2020-09-29 VITALS — BP 119/83 | HR 99 | Wt 171.0 lb

## 2020-09-29 DIAGNOSIS — Z3A37 37 weeks gestation of pregnancy: Secondary | ICD-10-CM

## 2020-09-29 DIAGNOSIS — Z3493 Encounter for supervision of normal pregnancy, unspecified, third trimester: Secondary | ICD-10-CM | POA: Insufficient documentation

## 2020-09-29 NOTE — Progress Notes (Signed)
   PRENATAL VISIT NOTE  Subjective:  Veronica Chapman is a 34 y.o. G2P1001 at [redacted]w[redacted]d being seen today for ongoing prenatal care.  She is currently monitored for the following issues for this low-risk pregnancy and has Supervision of low-risk pregnancy; Late prenatal care affecting pregnancy; and Language barrier on their problem list.  Patient reports no complaints.  Contractions: Not present. Vag. Bleeding: None.  Movement: Present. Denies leaking of fluid.   The following portions of the patient's history were reviewed and updated as appropriate: allergies, current medications, past family history, past medical history, past social history, past surgical history and problem list.   Objective:   Vitals:   09/29/20 1452  BP: 119/83  Pulse: 99  Weight: 171 lb (77.6 kg)    Fetal Status: Fetal Heart Rate (bpm): 133 Fundal Height: 36 cm Movement: Present  Presentation: Vertex  General:  Alert, oriented and cooperative. Patient is in no acute distress.  Skin: Skin is warm and dry. No rash noted.   Cardiovascular: Normal heart rate noted  Respiratory: Normal respiratory effort, no problems with respiration noted  Abdomen: Soft, gravid, appropriate for gestational age.  Pain/Pressure: Present     Pelvic: Cervical exam performed in the presence of a chaperone Dilation: 1 Effacement (%): 50 Station: -2  Extremities: Normal range of motion.  Edema: None  Mental Status: Normal mood and affect. Normal behavior. Normal judgment and thought content.   Assessment and Plan:  Pregnancy: G2P1001 at [redacted]w[redacted]d 1. Encounter for supervision of low-risk pregnancy in third trimester - GC/Chlamydia probe amp ([place])not at [facility] - Culture, beta strep (group b only)  2. [redacted] weeks gestation of pregnancy - Patient has not been seen since 11/10. She was staying in [place] and couldn't get here. She is in [place] now, and planning to stay here to have the baby.   Term labor symptoms and general obstetric  precautions including but not limited to vaginal bleeding, contractions, leaking of fluid and fetal movement were reviewed in detail with the patient. Please refer to After Visit Summary for other counseling recommendations.   Return in about 1 week (around 10/06/2020).  No future appointments.  Marcille Buffy DNP, CNM  09/29/20  3:16 PM

## 2020-09-30 LAB — GC/CHLAMYDIA PROBE AMP (~~LOC~~) NOT AT ARMC
Chlamydia: NEGATIVE
Comment: NEGATIVE
Comment: NORMAL
Neisseria Gonorrhea: NEGATIVE

## 2020-10-03 LAB — CULTURE, BETA STREP (GROUP B ONLY): Strep Gp B Culture: NEGATIVE

## 2020-10-05 ENCOUNTER — Inpatient Hospital Stay (HOSPITAL_COMMUNITY): Payer: Medicaid Other | Admitting: Anesthesiology

## 2020-10-05 ENCOUNTER — Other Ambulatory Visit: Payer: Self-pay

## 2020-10-05 ENCOUNTER — Encounter (HOSPITAL_COMMUNITY): Payer: Self-pay | Admitting: Family Medicine

## 2020-10-05 ENCOUNTER — Inpatient Hospital Stay (HOSPITAL_COMMUNITY)
Admission: AD | Admit: 2020-10-05 | Discharge: 2020-10-07 | DRG: 807 | Disposition: A | Discharge status: Home / Self Care | Payer: Medicaid Other | Attending: Obstetrics & Gynecology | Admitting: Obstetrics & Gynecology

## 2020-10-05 DIAGNOSIS — O0930 Supervision of pregnancy with insufficient antenatal care, unspecified trimester: Secondary | ICD-10-CM

## 2020-10-05 DIAGNOSIS — Z20822 Contact with and (suspected) exposure to covid-19: Secondary | ICD-10-CM | POA: Diagnosis present

## 2020-10-05 DIAGNOSIS — Z3A38 38 weeks gestation of pregnancy: Secondary | ICD-10-CM

## 2020-10-05 DIAGNOSIS — Z3493 Encounter for supervision of normal pregnancy, unspecified, third trimester: Secondary | ICD-10-CM

## 2020-10-05 DIAGNOSIS — Z603 Acculturation difficulty: Secondary | ICD-10-CM | POA: Diagnosis present

## 2020-10-05 DIAGNOSIS — O26893 Other specified pregnancy related conditions, third trimester: Secondary | ICD-10-CM | POA: Diagnosis present

## 2020-10-05 DIAGNOSIS — Z789 Other specified health status: Secondary | ICD-10-CM | POA: Diagnosis present

## 2020-10-05 LAB — TYPE AND SCREEN
ABO/RH(D): O POS
Antibody Screen: NEGATIVE

## 2020-10-05 LAB — RESP PANEL BY RT-PCR (FLU A&B, COVID) ARPGX2
Influenza A by PCR: NEGATIVE
Influenza B by PCR: NEGATIVE
SARS Coronavirus 2 by RT PCR: NEGATIVE

## 2020-10-05 LAB — CBC
HCT: 43.8 % (ref 36.0–46.0)
Hemoglobin: 14 g/dL (ref 12.0–15.0)
MCH: 24.6 pg — ABNORMAL LOW (ref 26.0–34.0)
MCHC: 32 g/dL (ref 30.0–36.0)
MCV: 76.8 fL — ABNORMAL LOW (ref 80.0–100.0)
Platelets: 339 10*3/uL (ref 150–400)
RBC: 5.7 MIL/uL — ABNORMAL HIGH (ref 3.87–5.11)
RDW: 13.9 % (ref 11.5–15.5)
WBC: 18.2 10*3/uL — ABNORMAL HIGH (ref 4.0–10.5)
nRBC: 0 % (ref 0.0–0.2)

## 2020-10-05 MED ORDER — LIDOCAINE HCL (PF) 1 % IJ SOLN
INTRAMUSCULAR | Status: DC | PRN
  Administered 2020-10-05: 7 mL via EPIDURAL

## 2020-10-05 MED ORDER — WITCH HAZEL-GLYCERIN EX PADS: 1.0000 "application " | MEDICATED_PAD | CUTANEOUS | Status: DC | PRN

## 2020-10-05 MED ORDER — ACETAMINOPHEN 325 MG PO TABS: 650.0000 mg | ORAL_TABLET | ORAL | Status: DC | PRN

## 2020-10-05 MED ORDER — DIBUCAINE (PERIANAL) 1 % EX OINT: 1.0000 "application " | TOPICAL_OINTMENT | CUTANEOUS | Status: DC | PRN

## 2020-10-05 MED ORDER — FENTANYL CITRATE (PF) 100 MCG/2ML IJ SOLN: 100.0000 ug | INTRAMUSCULAR | Status: DC | PRN

## 2020-10-05 MED ORDER — FLEET ENEMA 7-19 GM/118ML RE ENEM: 1.0000 | ENEMA | Freq: Every day | RECTAL | Status: DC | PRN

## 2020-10-05 MED ORDER — LACTATED RINGERS IV SOLN: INTRAVENOUS | Status: DC

## 2020-10-05 MED ORDER — LACTATED RINGERS IV SOLN
500.0000 mL | INTRAVENOUS | Status: DC | PRN
Start: 2020-10-05 — End: 2020-10-05

## 2020-10-05 MED ORDER — MEDROXYPROGESTERONE ACETATE 150 MG/ML IM SUSP
150.0000 mg | INTRAMUSCULAR | Status: AC | PRN
  Administered 2020-10-07: 13:00:00 150 mg via INTRAMUSCULAR
  Filled 2020-10-05: qty 1

## 2020-10-05 MED ORDER — DIPHENHYDRAMINE HCL 50 MG/ML IJ SOLN
12.5000 mg | INTRAMUSCULAR | Status: DC | PRN
Start: 2020-10-05 — End: 2020-10-05

## 2020-10-05 MED ORDER — FENTANYL CITRATE (PF) 100 MCG/2ML IJ SOLN
INTRAMUSCULAR | Status: AC
  Administered 2020-10-05: 18:00:00 100 ug via INTRAVENOUS
  Filled 2020-10-05: qty 2

## 2020-10-05 MED ORDER — OXYCODONE-ACETAMINOPHEN 5-325 MG PO TABS: 1.0000 | ORAL_TABLET | ORAL | Status: DC | PRN

## 2020-10-05 MED ORDER — PRENATAL MULTIVITAMIN CH
1.0000 | ORAL_TABLET | Freq: Every day | ORAL | Status: DC
  Administered 2020-10-06 – 2020-10-07 (×2): 1 via ORAL
  Filled 2020-10-05 (×2): qty 1

## 2020-10-05 MED ORDER — EPHEDRINE 5 MG/ML INJ: 10.0000 mg | INTRAVENOUS | Status: DC | PRN

## 2020-10-05 MED ORDER — PHENYLEPHRINE 40 MCG/ML (10ML) SYRINGE FOR IV PUSH (FOR BLOOD PRESSURE SUPPORT): 80.0000 ug | PREFILLED_SYRINGE | INTRAVENOUS | Status: DC | PRN

## 2020-10-05 MED ORDER — DIPHENHYDRAMINE HCL 25 MG PO CAPS: 25.0000 mg | ORAL_CAPSULE | Freq: Four times a day (QID) | ORAL | Status: DC | PRN

## 2020-10-05 MED ORDER — ACETAMINOPHEN 325 MG PO TABS
650.0000 mg | ORAL_TABLET | ORAL | Status: DC | PRN
  Administered 2020-10-06 – 2020-10-07 (×3): 650 mg via ORAL
  Filled 2020-10-05 (×3): qty 2

## 2020-10-05 MED ORDER — SIMETHICONE 80 MG PO CHEW: 80.0000 mg | CHEWABLE_TABLET | ORAL | Status: DC | PRN

## 2020-10-05 MED ORDER — BISACODYL 10 MG RE SUPP: 10.0000 mg | Freq: Every day | RECTAL | Status: DC | PRN

## 2020-10-05 MED ORDER — ONDANSETRON HCL 4 MG PO TABS: 4.0000 mg | ORAL_TABLET | ORAL | Status: DC | PRN

## 2020-10-05 MED ORDER — FENTANYL-BUPIVACAINE-NACL 0.5-0.125-0.9 MG/250ML-% EP SOLN
12.0000 mL/h | EPIDURAL | Status: DC | PRN
  Filled 2020-10-05: qty 250

## 2020-10-05 MED ORDER — ONDANSETRON HCL 4 MG/2ML IJ SOLN: 4.0000 mg | INTRAMUSCULAR | Status: DC | PRN

## 2020-10-05 MED ORDER — OXYCODONE-ACETAMINOPHEN 5-325 MG PO TABS: 2.0000 | ORAL_TABLET | ORAL | Status: DC | PRN

## 2020-10-05 MED ORDER — SODIUM CHLORIDE (PF) 0.9 % IJ SOLN
INTRAMUSCULAR | Status: DC | PRN
  Administered 2020-10-05: 12 mL/h via EPIDURAL

## 2020-10-05 MED ORDER — DOCUSATE SODIUM 100 MG PO CAPS
100.0000 mg | ORAL_CAPSULE | Freq: Two times a day (BID) | ORAL | Status: DC
  Administered 2020-10-06 – 2020-10-07 (×4): 100 mg via ORAL
  Filled 2020-10-05 (×4): qty 1

## 2020-10-05 MED ORDER — IBUPROFEN 600 MG PO TABS
600.0000 mg | ORAL_TABLET | Freq: Four times a day (QID) | ORAL | Status: DC
  Administered 2020-10-06 – 2020-10-07 (×7): 600 mg via ORAL
  Filled 2020-10-05 (×7): qty 1

## 2020-10-05 MED ORDER — MEASLES, MUMPS & RUBELLA VAC IJ SOLR: 0.5000 mL | Freq: Once | INTRAMUSCULAR | Status: DC

## 2020-10-05 MED ORDER — LACTATED RINGERS IV SOLN: 500.0000 mL | Freq: Once | INTRAVENOUS | Status: DC

## 2020-10-05 MED ORDER — OXYTOCIN-SODIUM CHLORIDE 30-0.9 UT/500ML-% IV SOLN
2.5000 [IU]/h | INTRAVENOUS | Status: DC
  Filled 2020-10-05: qty 500

## 2020-10-05 MED ORDER — SOD CITRATE-CITRIC ACID 500-334 MG/5ML PO SOLN: 30.0000 mL | ORAL | Status: DC | PRN

## 2020-10-05 MED ORDER — METHYLERGONOVINE MALEATE 0.2 MG PO TABS: 0.2000 mg | ORAL_TABLET | ORAL | Status: DC | PRN

## 2020-10-05 MED ORDER — ONDANSETRON HCL 4 MG/2ML IJ SOLN: 4.0000 mg | Freq: Four times a day (QID) | INTRAMUSCULAR | Status: DC | PRN

## 2020-10-05 MED ORDER — TETANUS-DIPHTH-ACELL PERTUSSIS 5-2.5-18.5 LF-MCG/0.5 IM SUSY: 0.5000 mL | PREFILLED_SYRINGE | Freq: Once | INTRAMUSCULAR | Status: DC

## 2020-10-05 MED ORDER — OXYTOCIN BOLUS FROM INFUSION
333.0000 mL | Freq: Once | INTRAVENOUS | Status: AC
  Administered 2020-10-05: 22:00:00 333 mL via INTRAVENOUS

## 2020-10-05 MED ORDER — COCONUT OIL OIL
1.0000 "application " | TOPICAL_OIL | Status: DC | PRN
  Administered 2020-10-07: 1 via TOPICAL

## 2020-10-05 MED ORDER — BENZOCAINE-MENTHOL 20-0.5 % EX AERO
1.0000 "application " | INHALATION_SPRAY | CUTANEOUS | Status: DC | PRN
  Administered 2020-10-06: 1 via TOPICAL
  Filled 2020-10-05: qty 56

## 2020-10-05 MED ORDER — FERROUS SULFATE 325 (65 FE) MG PO TABS
325.0000 mg | ORAL_TABLET | ORAL | Status: DC
  Administered 2020-10-06: 10:00:00 325 mg via ORAL
  Filled 2020-10-05: qty 1

## 2020-10-05 MED ORDER — LIDOCAINE HCL (PF) 1 % IJ SOLN: 30.0000 mL | INTRAMUSCULAR | Status: DC | PRN

## 2020-10-05 MED ORDER — METHYLERGONOVINE MALEATE 0.2 MG/ML IJ SOLN: 0.2000 mg | INTRAMUSCULAR | Status: DC | PRN

## 2020-10-05 NOTE — MAU Note (Signed)
LD charge Alyse Low called for room assignment

## 2020-10-05 NOTE — Lactation Note (Addendum)
This note was copied from a baby's chart. Lactation Consultation Note  Patient Name: Veronica Chapman [id number] Date: 10/05/2020 Reason for consult: L&D Initial assessment Age:34 hours  Consult was done in Spanish:   L&D consult with 55 minutes old infant and P2 mother. Parents and older brother present at time of consult. Congratulated them on their newborn. Assisted with skin to skin prone on mother's chest. Discussed STS as ideal transition for infants after birth helping with temperature, blood sugar and comfort. Talked about primal reflexes such as rooting, hands to mouth, searching for the breast among others.   Infant latched on her own laid back position to left breast.   Explained LC services availability during postpartum stay. Thanked family for their time.    Maternal Data Formula Feeding for Exclusion: No Does the patient have breastfeeding experience prior to this delivery?: Yes  Feeding Feeding Type: Breast Fed  LATCH Score Latch: Grasps breast easily, tongue down, lips flanged, rhythmical sucking.  Audible Swallowing: Spontaneous and intermittent  Type of Nipple: Everted at rest and after stimulation  Comfort (Breast/Nipple): Soft / non-tender  Hold (Positioning): No assistance needed to correctly position infant at breast.  LATCH Score: 10  Interventions Interventions: Breast feeding basics reviewed;Skin to skin;Expressed milk  Lactation Tools Discussed/Used     Consult Status Consult Status: Follow-up Date: 10/06/20 Follow-up type: In-patient    Jerimyah [place] A Higuera Ancidey 10/05/2020, 10:51 PM

## 2020-10-05 NOTE — Discharge Summary (Signed)
Postpartum Discharge Summary  Date of Service updated 10/07/20     Patient Name: Veronica Chapman DOB: June 08, 1986 MRN: [id number]  Date of admission: 10/05/2020 Delivery date:10/05/2020  Delivering provider: Christin Fudge  Date of discharge: 10/07/2020  Admitting diagnosis: Supervision of low-risk pregnancy, third trimester [Z34.93] Intrauterine pregnancy: [redacted]w[redacted]d    Secondary diagnosis:  Active Problems:   Late prenatal care affecting pregnancy   Language barrier   Supervision of low-risk pregnancy, third trimester   Vaginal delivery  Additional problems: none   Discharge diagnosis: Term Pregnancy Delivered                                              Post partum procedures:depo  Augmentation: none Complications: None  Hospital course: Onset of Labor With Vaginal Delivery      34y.o. yo G2P1001 at 325w0das admitted in Active Labor on 10/05/2020. Patient had an uncomplicated labor course as follows: admitted Membrane Rupture Time/Date: 6:54 PM ,10/05/2020   Delivery Method:Vaginal, Spontaneous  Episiotomy: None  Lacerations:  1st degree  Patient had an uncomplicated postpartum course.  She is ambulating, tolerating a regular diet, passing flatus, and urinating well. Patient is discharged home in stable condition on 10/07/20.  Newborn Data: Birth date:10/05/2020  Birth time:9:53 PM  Gender:Female  Living status:Living  Apgars:8 ,9  Weight:2350 g   Magnesium Sulfate received: No BMZ received: No Rhophylac:N/A MMR:N/A T-DaP:Given prenatally Flu: Yes Transfusion:No  Physical exam  Vitals:   10/06/20 0918 10/06/20 1457 10/06/20 2132 10/07/20 0525  BP: 122/78 91/63 117/68 121/80  Pulse: 88 78 84 69  Resp: _0 Temp: 97.7 F (36.5 C) 99.7 F (37.6 C) 97.8 F (36.6 C) 97.7 F (36.5 C)  TempSrc: Oral Oral Oral Oral  SpO2: 100% 98% 99% 100%  Weight:      Height:       General: alert, cooperative and no distress Lochia:  appropriate Uterine Fundus: firm Incision: N/A DVT Evaluation: No evidence of DVT seen on physical exam. Labs: Lab Results  Component Value Date   WBC 18.2 (H) 10/05/2020   HGB 14.0 10/05/2020   HCT 43.8 10/05/2020   MCV 76.8 (L) 10/05/2020   PLT 339 10/05/2020   No flowsheet data found. Edinburgh Score: Edinburgh Postnatal Depression Scale Screening Tool 10/06/2020  I have been able to laugh and see the funny side of things. 0  I have looked forward with enjoyment to things. 0  I have blamed myself unnecessarily when things went wrong. 0  I have been anxious or worried for no good reason. 0  I have felt scared or panicky for no good reason. 0  Things have been getting on top of me. 0  I have been so unhappy that I have had difficulty sleeping. 0  I have felt sad or miserable. 0  I have been so unhappy that I have been crying. 0  The thought of harming myself has occurred to me. 0  Edinburgh Postnatal Depression Scale Total 0     After visit meds:  Allergies as of 10/07/2020   No Known Allergies     Medication List    STOP taking these medications   famotidine 20 MG tablet Commonly known as: Pepcid     TAKE these medications   acetaminophen 500 MG tablet Commonly known as: TYLENOL Take  2 tablets (1,000 mg total) by mouth every 4 (four) hours as needed (for pain scale < 4).   coconut oil Oil Apply 1 application topically as needed.   ibuprofen 600 MG tablet Commonly known as: ADVIL Take 1 tablet (600 mg total) by mouth every 6 (six) hours.   PRENATAL VITAMINS PO Take 1 tablet by mouth daily.        Discharge home in stable condition Infant Feeding: Breast Infant Disposition:home with mother Discharge instruction: per After Visit Summary and Postpartum booklet. Activity: Advance as tolerated. Pelvic rest for 6 weeks.  Diet: routine diet Future Appointments: No future appointments. Follow up Visit:   Please schedule this patient for a In person  postpartum visit in 4 weeks with the following provider: Any provider. Additional Postpartum F/U:pap smear  Low risk pregnancy complicated by: nothing Delivery mode:  Vaginal, Spontaneous  Anticipated Birth Control:  Depo given in hospital    [id number] Arrie Senate, MD

## 2020-10-05 NOTE — Anesthesia Preprocedure Evaluation (Addendum)
Anesthesia Evaluation  Patient identified by MRN, date of birth, ID band Patient awake    Reviewed: Allergy & Precautions, H&P , NPO status , Patient's Chart, lab work & pertinent test results  Airway Mallampati: II  TM Distance: >3 FB Neck ROM: Full    Dental no notable dental hx.    Pulmonary neg pulmonary ROS,    Pulmonary exam normal breath sounds clear to auscultation       Cardiovascular negative cardio ROS Normal cardiovascular exam Rhythm:Regular Rate:Normal     Neuro/Psych negative neurological ROS  negative psych ROS   GI/Hepatic negative GI ROS, Neg liver ROS,   Endo/Other  negative endocrine ROS  Renal/GU negative Renal ROS  negative genitourinary   Musculoskeletal negative musculoskeletal ROS (+)   Abdominal   Peds negative pediatric ROS (+)  Hematology negative hematology ROS (+)   Anesthesia Other Findings   Reproductive/Obstetrics negative OB ROS                             Anesthesia Physical Anesthesia Plan  ASA: II  Anesthesia Plan: Epidural   Post-op Pain Management:    Induction: Intravenous  PONV Risk Score and Plan: 2  Airway Management Planned: Natural Airway  Additional Equipment: None  Intra-op Plan:   Post-operative Plan:   Informed Consent: I have reviewed the patients History and Physical, chart, labs and discussed the procedure including the risks, benefits and alternatives for the proposed anesthesia with the patient or authorized representative who has indicated his/her understanding and acceptance.       Plan Discussed with: Anesthesiologist  Anesthesia Plan Comments: (Lab Results      Component                Value               Date                      WBC                      18.2 (H)            10/05/2020                HGB                      14.0                10/05/2020                HCT                      43.8                 10/05/2020                MCV                      76.8 (L)            10/05/2020                PLT                      339                 10/05/2020          )  Anesthesia Quick Evaluation  

## 2020-10-05 NOTE — Anesthesia Procedure Notes (Signed)
Epidural Patient location during procedure: OB Start time: 10/05/2020 6:15 PM End time: 10/05/2020 6:20 PM  Staffing Anesthesiologist: Atilano Median, DO Performed: anesthesiologist   Preanesthetic Checklist Completed: patient identified, IV checked, site marked, risks and benefits discussed, surgical consent, monitors and equipment checked, pre-op evaluation and timeout performed  Epidural Patient position: sitting Prep: ChloraPrep Patient monitoring: heart rate, continuous pulse ox and blood pressure Approach: midline Location: L3-L4 Injection technique: LOR saline  Needle:  Needle type: Tuohy  Needle gauge: 17 G Needle length: 9 cm Needle insertion depth: 7 cm Catheter type: closed end flexible Catheter size: 20 Guage Catheter at skin depth: 12 cm Test dose: negative and 1.5% lidocaine  Assessment Events: blood not aspirated, injection not painful, no injection resistance and no paresthesia  Additional Notes Patient identified. Risks/Benefits/Options discussed with patient including but not limited to bleeding, infection, nerve damage, paralysis, failed block, incomplete pain control, headache, blood pressure changes, nausea, vomiting, reactions to medications, itching and postpartum back pain. Confirmed with bedside nurse the patient's most recent platelet count. Confirmed with patient that they are not currently taking any anticoagulation, have any bleeding history or any family history of bleeding disorders. Patient expressed understanding and wished to proceed. All questions were answered. Sterile technique was used throughout the entire procedure. Please see nursing notes for vital signs. Test dose was given through epidural catheter and negative prior to continuing to dose epidural or start infusion. Warning signs of high block given to the patient including shortness of breath, tingling/numbness in hands, complete motor block, or any concerning symptoms with  instructions to call for help. Patient was given instructions on fall risk and not to get out of bed. All questions and concerns addressed with instructions to call with any issues or inadequate analgesia.    Reason for block:procedure for pain

## 2020-10-05 NOTE — H&P (Signed)
OBSTETRIC ADMISSION HISTORY AND PHYSICAL  Veronica Chapman is a 34 y.o. female G2P1001 with IUP at [redacted]w[redacted]d by LMP presenting for spontaneous onset of labor. She reports +FMs, No LOF, no VB, no blurry vision, headaches or peripheral edema, and RUQ pain.  She plans on breast and bottle feeding. She requests depo for birth control.  She received her prenatal [facility] at [facility]   Dating: By LMP --->  Estimated Date of Delivery: 10/19/20  Sono:  @[redacted]w[redacted]d , CWD, normal anatomy, 1179g, 31.3% EFW  Prenatal History/Complications:  - late prenatal care - language barrier (Spanish)  Past Medical History: History reviewed. No pertinent past medical history.  Past Surgical History: Past Surgical History:  Procedure Laterality Date   APPENDECTOMY      Obstetrical History: OB History    Gravida  2   Para  1   Term  1   Preterm      AB      Living  1     SAB      IAB      Ectopic      Multiple      Live Births  1           Social History Social History   Socioeconomic History   Marital status: Significant Other    Spouse name: Not on file   Number of children: Not on file   Years of education: Not on file   Highest education level: Not on file  Occupational History   Not on file  Tobacco Use   Smoking status: Never Smoker   Smokeless tobacco: Never Used  Vaping Use   Vaping Use: Never used  Substance and Sexual Activity   Alcohol use: Never   Drug use: Never   Sexual activity: Yes    Birth control/protection: None  Other Topics Concern   Not on file  Social History Narrative   Not on file   Social Determinants of Health   Financial Resource Strain: Not on file  Food Insecurity: Food Insecurity Present   Worried About Running Out of Food in the Last Year: Sometimes true   Ran Out of Food in the Last Year: Sometimes true  Transportation Needs: No Transportation Needs   Lack of Transportation (Medical): No   Lack of Transportation  (Non-Medical): No  Physical Activity: Not on file  Stress: Not on file  Social Connections: Not on file    Family History: Family History  Problem Relation Age of Onset   Thyroid disease Mother    Diabetes Father     Allergies: No Known Allergies  Medications Prior to Admission  Medication Sig Dispense Refill Last Dose   Prenatal Vit-Fe Fumarate-FA (PRENATAL VITAMINS PO) Take 1 tablet by mouth daily.   10/05/2020 at Unknown time   famotidine (PEPCID) 20 MG tablet Take 1 tablet (20 mg total) by mouth 2 (two) times daily. 60 tablet 3      Review of Systems   All systems reviewed and negative except as stated in HPI  Blood pressure 130/76, pulse 86, temperature 98.3 F (36.8 C), temperature source Oral, resp. rate 17, height 5' 2.21" (1.58 m), weight 77.6 kg, last menstrual period 01/13/2020, SpO2 98 %. General appearance: alert, cooperative and appears stated age Lungs: normal WOB Heart: regular rate  Abdomen: soft, non-tender Extremities: no sign of DVT Presentation: cephalic Fetal monitoringBaseline: 140 bpm, Variability: Good {> 6 bpm), Accelerations: Reactive and Decelerations: Absent Uterine activityFrequency: Every 2 minutes Dilation: 5 Effacement (%):  100 Station: 0 Exam by:: DFM  Prenatal labs: ABO, Rh: O/Positive/-- (09/28 1518) Antibody: Negative (09/28 1518) Rubella: 1.24 (09/28 1518) RPR: Non Reactive (09/28 1518)  HBsAg: Negative (09/28 1518)  HIV: Non Reactive (09/28 1518)  GBS: Negative/-- (12/21 1456)  2 hr Glucola wnl Genetic screening  wnl Anatomy US wnl  Prenatal Transfer Tool  Maternal Diabetes: No Genetic Screening: Normal Maternal Ultrasounds/Referrals: Normal Fetal Ultrasounds or other Referrals:  None Maternal Substance Abuse:  No Significant Maternal Medications:  None Significant Maternal Lab Results: Group B Strep negative  No results found for this or any previous visit (from the past 24 hour(s)).  Patient Active Problem  List   Diagnosis Date Noted   Supervision of low-risk pregnancy 07/07/2020   Late prenatal care affecting pregnancy 07/07/2020   Language barrier 07/07/2020    Assessment/Plan:  Veronica Chapman is a 34 y.o. G2P1001 at [redacted]w[redacted]d here for for spontaneous onset of labor.  #Labor: Will plan to manage expectantly and augment as clinically indicated. #Pain: TBD per pt request #FWB: Category 1 strip #ID: GBS negative #MOF: breast & bottle #MOC: depo #Circ: n/a #Late PNC: plan for SW consult in postpartum period.  Sheila Oats, MD OB Fellow, Faculty Practice 10/05/2020 5:38 PM

## 2020-10-05 NOTE — MAU Note (Signed)
Pt arrived with complaints of contractions that began at 2200 last evening. Pt is G2P1 at 38 weeks with an EDC of 10/19/2020. Pt denies SROM. Reports she lost her mucous plug and has had a small amount of bloody show. Endorses + fetal movement. Hospital interpreter present for intake and assessment.

## 2020-10-05 NOTE — Plan of Care (Signed)

## 2020-10-06 LAB — RPR
RPR Ser Ql: REACTIVE — AB
RPR Titer: 1:1 {titer}

## 2020-10-06 NOTE — Lactation Note (Signed)
This note was copied from a baby's chart. Lactation Consultation Note  Patient Name: Veronica Chapman GYIRS'W Date: 10/06/2020 Reason for consult: Initial assessment Age: 87hrs old In-house Spanish interpreter Mattie Marlin present for visit.  Baby with 1% wt gain. Mom sitting in bed nursing baby at left breast, dad sitting in chair, states baby starting feeding ~9mins prior to [facility] entering room. Baby noted with tight angle and multiple swallows at breast. Mom reports breast and formula feeding, states baby took with last feeding. Reports baby normally nurses ~79mins, then offers formula afterwards if baby is still awake and hungry.   LC encouraged mom to compress breast and pull baby close, baby noted with wider angle and increased swallows. Discussed supplementing with EBM via DEBP, mom declined, requests to continue with formula. Discussed cue based feedings, 8-12 in 24hrs, wake if >3hrs since last feeding, keep total feeding to or less, skin to skin, hand expression. Mom voiced understanding and with no further concerns. Left the room with baby still nursing ~67min mark. BGilliam, RN, IBCLC with [employer], S-IBCLC  Maternal Data Formula Feeding for Exclusion: No Has patient been taught Hand Expression?: Yes Does the patient have breastfeeding experience prior to this delivery?: Yes  Feeding Feeding Type: Breast Fed  LATCH Score Latch: Grasps breast easily, tongue down, lips flanged, rhythmical sucking.  Audible Swallowing: Spontaneous and intermittent  Type of Nipple: Everted at rest and after stimulation  Comfort (Breast/Nipple): Soft / non-tender  Hold (Positioning): Assistance needed to correctly position infant at breast and maintain latch.  LATCH Score: 9  Interventions Interventions: Breast feeding basics reviewed;Skin to skin;Assisted with latch;Breast compression  Lactation Tools Discussed/Used WIC Program: Yes   Consult Status Consult  Status: Follow-up Date: 10/07/20 Follow-up type: In-patient    Charlynn Court 10/06/2020, 2:56 PM

## 2020-10-06 NOTE — Anesthesia Postprocedure Evaluation (Signed)
Anesthesia Post Note  Patient: Zelma Personnel officer  Procedure(s) Performed: AN AD HOC LABOR EPIDURAL     Patient location during evaluation: Mother Baby Anesthesia Type: Epidural Level of consciousness: awake and alert and oriented Pain management: satisfactory to patient Vital Signs Assessment: post-procedure vital signs reviewed and stable Respiratory status: spontaneous breathing and nonlabored ventilation Cardiovascular status: stable Postop Assessment: no headache, no backache, no signs of nausea or vomiting, adequate PO intake, patient able to bend at knees and able to ambulate (patient up walking) Anesthetic complications: no   No complications documented.  Last Vitals:  Vitals:   10/06/20 0100 10/06/20 0500  BP: 116/62 111/72  Pulse: 97 80  Resp: 16 16  Temp: 36.7 C 36.7 C  SpO2: 100% 100%    Last Pain:  Vitals:   10/06/20 0500  TempSrc: Oral  PainSc: 0-No pain   Pain Goal:                   Macallister Ashmead

## 2020-10-06 NOTE — Progress Notes (Signed)
Post Partum Day 1 Subjective:  Patient is doing well without complaints. Ambulating without difficulty. Voiding and passing flatus. Tolerating PO. Abdominal pain improved. Vaginal bleeding decreased.  Objective: Blood pressure 111/72, pulse 80, temperature 98.1 F (36.7 C), temperature source Oral, resp. rate 16, height 5' 2.21" (1.58 m), weight 77.6 kg, last menstrual period 01/13/2020, SpO2 100 %, unknown if currently breastfeeding.  Physical Exam:  General: alert, cooperative and no distress Lochia: appropriate Uterine Fundus: firm Incision: n/a DVT Evaluation: No evidence of DVT seen on physical exam.  Recent Labs    10/05/20 1717  HGB 14.0  HCT 43.8    Assessment/Plan: PPD#1 SVD  -doing well, meeting pp milestones  -VSS  -breastfeeding, awaiting milk supply  -desires depo for contraception, previously ordered  #Late PNC: SW ordered previously  Plan for discharge tomorrow given time of delivery.   LOS: 1 day   Alric Seton 10/06/2020, 7:24 AM

## 2020-10-06 NOTE — Progress Notes (Signed)
CSW received consult for late [facility].  CSW reviewed chart and is screening out consult as it does not meet criteria for automatic CSW involvement and infant drug screening.  MOB started care prior to 28 weeks and had more than 3 visits 25 weeks 1 day- 07/07/20, 27 weeks and 1 day-07/21/20. 29 weeks and 2 days-08/05/20, 31 weeks and 2 days-08/19/20, and 37 weeks and 1 day-12/21.      Please contact CSW if current concerns arise or by MOB's request.    Claude Manges. Anastyn Ayars, MSW, LCSW [facility] [place] [phone]

## 2020-10-07 ENCOUNTER — Encounter: Payer: Self-pay | Admitting: Medical

## 2020-10-07 LAB — T.PALLIDUM AB, TOTAL: T Pallidum Abs: NONREACTIVE

## 2020-10-07 MED ORDER — COCONUT OIL OIL: 1.0000 "application " | TOPICAL_OIL | 0 refills | Status: DC | PRN

## 2020-10-07 MED ORDER — IBUPROFEN 600 MG PO TABS: 600.0000 mg | ORAL_TABLET | Freq: Four times a day (QID) | ORAL | 0 refills | Status: DC

## 2020-10-07 MED ORDER — ACETAMINOPHEN 500 MG PO TABS: 1000.0000 mg | ORAL_TABLET | ORAL | Status: DC | PRN

## 2020-10-14 ENCOUNTER — Encounter: Payer: Self-pay | Admitting: Certified Nurse Midwife

## 2020-10-21 ENCOUNTER — Encounter: Payer: Self-pay | Admitting: Advanced Practice Midwife

## 2020-11-03 ENCOUNTER — Ambulatory Visit: Payer: Self-pay | Admitting: Nurse Practitioner

## 2020-11-03 ENCOUNTER — Encounter: Payer: Self-pay | Admitting: Family Medicine

## 2021-01-06 ENCOUNTER — Ambulatory Visit: Payer: Self-pay | Admitting: Obstetrics and Gynecology

## 2021-01-06 NOTE — Progress Notes (Signed)
Patient did not keep appointment today for pap smear. Provider would like patient to be rescheduled. Will send message to registrar.  Franklin Baumbach

## 2021-01-11 ENCOUNTER — Telehealth: Payer: Self-pay | Admitting: Family Medicine

## 2021-01-11 NOTE — Telephone Encounter (Signed)
Unable to reach pt to reschedule PAP. No mychart.  I sent reminder letter with new appt date/time

## 2021-01-19 ENCOUNTER — Ambulatory Visit: Payer: Self-pay | Admitting: Family Medicine

## 2021-01-19 ENCOUNTER — Other Ambulatory Visit: Payer: Self-pay

## 2021-03-17 ENCOUNTER — Other Ambulatory Visit: Payer: Self-pay

## 2021-03-17 DIAGNOSIS — N6310 Unspecified lump in the right breast, unspecified quadrant: Secondary | ICD-10-CM

## 2021-03-30 ENCOUNTER — Ambulatory Visit
Admission: RE | Admit: 2021-03-30 | Discharge: 2021-03-30 | Disposition: A | Discharge status: Home / Self Care | Payer: No Typology Code available for payment source | Source: Ambulatory Visit | Attending: Obstetrics and Gynecology | Admitting: Obstetrics and Gynecology

## 2021-03-30 ENCOUNTER — Other Ambulatory Visit: Payer: Self-pay | Admitting: Obstetrics and Gynecology

## 2021-03-30 ENCOUNTER — Other Ambulatory Visit: Payer: Self-pay

## 2021-03-30 ENCOUNTER — Ambulatory Visit: Payer: No Typology Code available for payment source | Admitting: *Deleted

## 2021-03-30 VITALS — BP 118/78 | Wt 168.6 lb

## 2021-03-30 DIAGNOSIS — N6310 Unspecified lump in the right breast, unspecified quadrant: Secondary | ICD-10-CM

## 2021-03-30 DIAGNOSIS — R87610 Atypical squamous cells of undetermined significance on cytologic smear of cervix (ASC-US): Secondary | ICD-10-CM

## 2021-03-30 DIAGNOSIS — N6315 Unspecified lump in the right breast, overlapping quadrants: Secondary | ICD-10-CM

## 2021-03-30 DIAGNOSIS — Z1239 Encounter for other screening for malignant neoplasm of breast: Secondary | ICD-10-CM

## 2021-03-30 DIAGNOSIS — R87810 Cervical high risk human papillomavirus (HPV) DNA test positive: Secondary | ICD-10-CM

## 2021-03-30 NOTE — Patient Instructions (Signed)
Explained breast self awareness with [place]. Patient did not need a Pap smear today due to last Pap smear was 03/03/2021. Explained the colposcopy the recommended follow-up for her abnormal Pap smear. Referred patient to the [facility] for a diagnostic mammogram. Appointment scheduled Tuesday, March 30, 2021 at 1400. Patient aware of appointments and will be there. [place] verbalized understanding.  Casper Pagliuca, Arvil Chaco, RN 11:33 AM

## 2021-03-30 NOTE — Progress Notes (Signed)
Veronica Chapman is a 35 y.o. female who presents to [facility] clinic today with complaint of right breast lump x 2 years that has increased in size since January 2022. Patient referred to [place] by the [facility] due to having an abnormal Pap smear 03/03/2021 that a colposcopy is recommended for follow-up.    Pap Smear: Pap smear not completed today. Last Pap smear was 03/03/2021 at the [facility] clinic and was abnormal - ASCUS with positive HPV . Per patient has no history of an abnormal Pap smear prior to her most recent Pap smear. Last Pap smear result is available in Epic.   Physical exam: Breasts Breasts symmetrical. No skin abnormalities bilateral breasts. No nipple retraction bilateral breasts. No nipple discharge bilateral breasts. No lymphadenopathy. No lumps palpated left breast. Palpated a mobile lump within the right breast at 12 o'clock 8.5 cm from the nipple. No complaints of pain or tenderness on exam.    Pelvic/Bimanual Pap is not indicated today per BCCCP guidelines.   Smoking History: Patient has never smoked.   Patient Navigation: Patient education provided. Access to services provided for patient through [place] program. Spanish interpreter Rudene Anda from [facility] provided. Grab and go bag of food provided to patient. Patient given information about the food pantry at the [facility] for [employer].   Breast and Cervical Cancer Risk Assessment: Patient has family history of her sister having breast cancer. Patient has no known genetic mutations or history of radiation treatment to the chest before age 74. Patient does not have history of cervical dysplasia, immunocompromised, or DES exposure in-utero.  Risk Assessment     Risk Scores       03/30/2021   Last edited by: Royston Bake, CMA   5-year risk: 0.3 %   Lifetime risk: 12 %            A: BCCCP exam without pap smear Complaint of right  breast lump.  P: Referred patient to the [facility] for a diagnostic mammogram. Appointment scheduled Tuesday, March 30, 2021 at 1400.  Referred patient to the [facility] for [facility] for a colposcopy to follow-up for her abnormal Pap smear. Appointment scheduled Wednesday, May 12, 2021 at 1435.  Loletta Parish, RN 03/30/2021 11:33 AM

## 2021-04-06 ENCOUNTER — Other Ambulatory Visit: Payer: No Typology Code available for payment source

## 2021-04-07 ENCOUNTER — Ambulatory Visit: Payer: Self-pay

## 2021-04-15 ENCOUNTER — Ambulatory Visit
Admission: RE | Admit: 2021-04-15 | Discharge: 2021-04-15 | Disposition: A | Discharge status: Home / Self Care | Payer: No Typology Code available for payment source | Source: Ambulatory Visit | Attending: Obstetrics and Gynecology | Admitting: Obstetrics and Gynecology

## 2021-04-15 ENCOUNTER — Other Ambulatory Visit: Payer: Self-pay

## 2021-04-15 DIAGNOSIS — N6310 Unspecified lump in the right breast, unspecified quadrant: Secondary | ICD-10-CM

## 2021-05-12 ENCOUNTER — Other Ambulatory Visit: Payer: Self-pay

## 2021-05-12 ENCOUNTER — Other Ambulatory Visit (HOSPITAL_COMMUNITY)
Admission: RE | Admit: 2021-05-12 | Discharge: 2021-05-12 | Disposition: A | Discharge status: Home / Self Care | Payer: Self-pay | Source: Ambulatory Visit | Attending: Obstetrics & Gynecology | Admitting: Obstetrics & Gynecology

## 2021-05-12 ENCOUNTER — Encounter: Payer: Self-pay | Admitting: Obstetrics & Gynecology

## 2021-05-12 ENCOUNTER — Ambulatory Visit (INDEPENDENT_AMBULATORY_CARE_PROVIDER_SITE_OTHER): Payer: Self-pay | Admitting: Obstetrics & Gynecology

## 2021-05-12 VITALS — BP 119/70 | HR 97 | Ht 59.84 in | Wt 166.0 lb

## 2021-05-12 DIAGNOSIS — R87810 Cervical high risk human papillomavirus (HPV) DNA test positive: Secondary | ICD-10-CM | POA: Insufficient documentation

## 2021-05-12 DIAGNOSIS — Z3202 Encounter for pregnancy test, result negative: Secondary | ICD-10-CM

## 2021-05-12 DIAGNOSIS — R87610 Atypical squamous cells of undetermined significance on cytologic smear of cervix (ASC-US): Secondary | ICD-10-CM

## 2021-05-12 LAB — POCT PREGNANCY, URINE: Preg Test, Ur: NEGATIVE

## 2021-05-12 NOTE — Patient Instructions (Signed)
[email or web address]">  Colposcopa, cuidados posteriores Colposcopy, Care After Esta hoja le brinda informacin sobre cmo cuidarse despus del procedimiento. Su mdico tambin podr darle instrucciones ms especficas. Comunquese con elmdico si tiene problemas o preguntas. Qu puedo esperar despus del procedimiento? Si le realizaron una colposcopa sin biopsia, puede esperar sentirse bien inmediatamente despus del procedimiento. Sin embargo, es posible que tenga algo de [place] de sangre durante [place]. Puede retomar sus actividadeshabituales. Si se le realiz una colposcopa con biopsia, despus del procedimiento es frecuente que presente lo siguiente: Sensibilidad y dolor leve. [place]. Desvanecimiento. Sangrado leve de la vagina o secrecin de color oscuro y Therapist, art. [place]. La secrecin puede deberse a un lquido (solucin) que se [place] durante el procedimiento. Durante este tiempo deber usar un apsito sanitario. [place] de [employer] al menos 48 horas despus del procedimiento. Siga estas instrucciones en su casa: Medicamentos Use los medicamentos de venta libre y los recetados solamente como se lo haya indicado el mdico. Hable con el mdico acerca de qu tipo de analgsico de venta libre y recetado puede volver a Radio producer. Es especialmente importante que hable con el mdico si toma anticoagulantes. Actividad Limite la actividad fsica Agricultural consultant despus del procedimiento como se lo haya indicado el mdico. Evite usar productos de ducha vaginal, usar tampones o Office manager sexuales durante al menos 3 das despus del procedimiento o durante el tiempo que le hayan indicado. Retome sus actividades normales segn lo indicado el mdico. Pregntele al mdico qu actividades son seguras para usted. Instrucciones generales  Beber suficiente lquido como para mantener la orina de color amarillo  plido. Pregunte al mdico si puede tomar baos de inmersin, nadar o usar el jacuzzi. Puede ducharse. Si [place] un mtodo anticonceptivo (anticoncepcin), contine utilizndolo. Concurra a todas las visitas de seguimiento como se lo haya indicado el mdico. Esto es importante.  Comunquese con un mdico si: Tiene una erupcin cutnea. Solicite ayuda de inmediato si: Sangra mucho por la vagina o le salen cogulos de [place]. Esto incluye usar ms de un apsito sanitario cada hora durante 2 horas seguidas. Tiene fiebre o escalofros. Tiene secrecin vaginal que es anormal, tiene color amarillo o tiene mal olor. Puede ser un signo de infeccin. Tiene dolor intenso o clicos en la parte baja del abdomen que no se van con medicamentos. Se desmaya. Resumen Si se le realiz una colposcopa sin biopsia, puede esperar sentirse bien de inmediato, pero es posible que presente manchas de sangre por algunos das. Puede retomar sus actividades habituales. Si se le realiz una colposcopa con biopsia, es comn tener un dolor leve durante algunos das y [place] de sangre durante 48 horas despus del procedimiento. Evite usar productos de ducha vaginal, usar tampones y Office manager sexuales durante al menos 3 das despus del procedimiento o durante el tiempo que le haya indicado el mdico. [place] ayuda de inmediato si tiene sangrado profuso, dolor intenso o signos de infeccin. Esta informacin no tiene Marine scientist el consejo del mdico. Asegresede hacerle al mdico cualquier pregunta que tenga. Document Revised: 11/04/2019 Document Reviewed: 11/04/2019 Elsevier Patient Education  2022 [employer].

## 2021-05-12 NOTE — Progress Notes (Signed)
    GYNECOLOGY OFFICE COLPOSCOPY PROCEDURE NOTE  35 y.o. 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 Patient is Spanish-speaking only, interpreter present for this encounter. Discussed role for HPV in cervical dysplasia, need for surveillance.  Patient gave informed written consent, time out was performed.  Placed in lithotomy position. Cervix viewed with speculum and colposcope after application of acetic acid.   Colposcopy adequate? Yes. Has prominent ectropion. No visible lesions.  ECC specimen obtained, labeled and sent to pathology.  Patient was given post procedure instructions.  Will follow up pathology and manage accordingly; patient will be contacted with results and recommendations.  Routine preventative health maintenance measures emphasized.    Verita Schneiders, MD, [place] for [employer], [place]

## 2021-05-14 LAB — SURGICAL PATHOLOGY

## 2021-05-17 ENCOUNTER — Telehealth: Payer: Self-pay

## 2021-05-17 NOTE — Telephone Encounter (Addendum)
-----   Message from Osborne Oman, MD sent at 05/15/2021 11:52 AM EDT ----- 05/12/2021 Colposcopy ECC benign >> repeat cotesting in 1 year Please call to inform patient of results and recommendations.   Called pt with interpreter Claudia. Pt given results and provider recommendation.

## 2021-10-28 ENCOUNTER — Other Ambulatory Visit: Payer: Self-pay | Admitting: Obstetrics and Gynecology

## 2021-10-28 DIAGNOSIS — Z09 Encounter for follow-up examination after completed treatment for conditions other than malignant neoplasm: Secondary | ICD-10-CM

## 2021-10-28 DIAGNOSIS — D242 Benign neoplasm of left breast: Secondary | ICD-10-CM

## 2021-11-22 ENCOUNTER — Ambulatory Visit
Admission: RE | Admit: 2021-11-22 | Discharge: 2021-11-22 | Disposition: A | Discharge status: Home / Self Care | Payer: No Typology Code available for payment source | Source: Ambulatory Visit | Attending: Obstetrics and Gynecology | Admitting: Obstetrics and Gynecology

## 2021-11-22 ENCOUNTER — Other Ambulatory Visit: Payer: Self-pay | Admitting: Obstetrics and Gynecology

## 2021-11-22 DIAGNOSIS — D242 Benign neoplasm of left breast: Secondary | ICD-10-CM

## 2021-11-22 DIAGNOSIS — Z09 Encounter for follow-up examination after completed treatment for conditions other than malignant neoplasm: Secondary | ICD-10-CM

## 2022-03-28 ENCOUNTER — Other Ambulatory Visit: Payer: Self-pay

## 2022-03-28 DIAGNOSIS — D242 Benign neoplasm of left breast: Secondary | ICD-10-CM

## 2022-05-24 ENCOUNTER — Ambulatory Visit
Admission: RE | Admit: 2022-05-24 | Discharge: 2022-05-24 | Disposition: A | Discharge status: Home / Self Care | Payer: No Typology Code available for payment source | Source: Ambulatory Visit | Attending: Obstetrics and Gynecology | Admitting: Obstetrics and Gynecology

## 2022-05-24 ENCOUNTER — Ambulatory Visit
Admission: RE | Admit: 2022-05-24 | Discharge: 2022-05-24 | Disposition: A | Discharge status: Home / Self Care | Payer: Self-pay | Source: Ambulatory Visit | Attending: Obstetrics and Gynecology | Admitting: Obstetrics and Gynecology

## 2022-05-24 ENCOUNTER — Ambulatory Visit: Payer: Self-pay | Admitting: *Deleted

## 2022-05-24 VITALS — BP 112/76 | Wt 157.1 lb

## 2022-05-24 DIAGNOSIS — N6315 Unspecified lump in the right breast, overlapping quadrants: Secondary | ICD-10-CM

## 2022-05-24 DIAGNOSIS — N6325 Unspecified lump in the left breast, overlapping quadrants: Secondary | ICD-10-CM

## 2022-05-24 DIAGNOSIS — D242 Benign neoplasm of left breast: Secondary | ICD-10-CM

## 2022-05-24 DIAGNOSIS — Z01419 Encounter for gynecological examination (general) (routine) without abnormal findings: Secondary | ICD-10-CM

## 2022-05-24 NOTE — Patient Instructions (Incomplete)
Taught [place] how to perform BSE and gave educational materials to take home. Patient did not need a Pap smear today due to last Pap smear was in *** per patient. Told patient about free cervical cancer screenings to receive a Pap smear if would like one next year. Let her know [facility] will cover Pap smears every 3 years unless has a history of abnormal Pap smears. Referred patient to the [place] [facility] for diagnostic mammogram. Appointment scheduled for ***, 2014 at ***. Patient aware of appointment and will be there. Let patient know will follow up with her within the next couple weeks with results. [place]en verbalized understanding.  Oluwadamilare Tobler, Arvil Chaco, RN '@T'$ @ 1:17 PM

## 2022-05-24 NOTE — Progress Notes (Unsigned)
[name] is a 36 y.o. [phone] female who presents to [facility] clinic today with {Blank single:19197::"no complaints","complaint of"} ***.    Pap Smear: Pap smear completed today. Last Pap smear was *** at *** clinic and was {Blank single:19197::"normal","abnormal - ***"}. Per patient has {Blank single:19197::"no history","history"} of an abnormal Pap smear. Last Pap smear result {Blank single:19197::"is available in","is not available in"} Epic.   Physical exam: Breasts Breasts symmetrical. No skin abnormalities bilateral breasts. No nipple retraction bilateral breasts. No nipple discharge bilateral breasts. No lymphadenopathy. No lumps palpated bilateral breasts.       Pelvic/Bimanual Ext Genitalia No lesions, no swelling and no discharge observed on external genitalia.        Vagina Vagina pink and normal texture. No lesions or discharge observed in vagina.        Cervix Cervix is present. Cervix pink and of normal texture. No discharge observed.    Uterus Uterus is present and palpable. Uterus in normal position and normal size.        Adnexae Bilateral ovaries present and palpable. No tenderness on palpation.         Rectovaginal No rectal exam completed today since patient had no rectal complaints. No skin abnormalities observed on exam.     Smoking History: Patient has {Blank single:19197::"never smoked","is a former smoker","is a current smoker at *** packs per day"} ***referred to quit line.    Patient Navigation: Patient education provided. Access to services provided for patient through [facility] program. *** interpreter provided. *** transportation provided   Colorectal Cancer Screening: Per patient {Blank single:19197::"has had colonoscopy completed on ***","has never had colonoscopy completed"} No complaints today.    Breast and Cervical Cancer Risk Assessment: Patient {Blank single:19197::"has","does not have"} family history of breast cancer, known  genetic mutations, or radiation treatment to the chest before age 29. Patient {Blank single:19197::"has","does not have"} history of cervical dysplasia, immunocompromised, or DES exposure in-utero.  Risk Assessment     Risk Scores       05/24/2022 03/30/2021   Last edited by: Royston Bake, CMA Royston Bake, CMA   5-year risk: 0.4 % 0.3 %   Lifetime risk: 12 % 12 %            A: [facility] exam with pap smear Complaint of ***  P: Referred patient to the [facility] for a {Blank single:19197::"screening","diagnostic"} mammogram. Appointment scheduled ***.  Loletta Parish, RN 05/24/2022 1:17 PM

## 2022-05-30 ENCOUNTER — Telehealth: Payer: Self-pay

## 2022-05-30 LAB — CYTOLOGY - PAP
Comment: NEGATIVE
Diagnosis: NEGATIVE
High risk HPV: NEGATIVE

## 2022-05-30 NOTE — Telephone Encounter (Signed)
Called patient via Veronica Chapman, [employer] to give pap smear results. Informed patient that pap smear was normal and HPV was negative. Based on this result and her previous hx her next pap smear will be due in 1 year. Patient voiced understanding.

## 2023-05-06 IMAGING — MG MM DIGITAL DIAGNOSTIC UNILAT*L* W/ TOMO W/ CAD
4 series · 4 of 12 positions shown · non-contrast
Comparison: Previous exam(s).

CLINICAL DATA: Follow-up for probably benign mass in the outer LEFT
breast. This probably benign mass was initially identified on
diagnostic mammogram and ultrasound dated 03/30/2021.

History of 2 benign RIGHT breast ultrasound-guided biopsies on
04/15/2021 with pathology results of fibroadenoma and fibrocystic
change.
EXAM:
DIGITAL DIAGNOSTIC UNILATERAL LEFT MAMMOGRAM WITH TOMOSYNTHESIS AND
CAD; ULTRASOUND LEFT BREAST LIMITED
TECHNIQUE: Left digital diagnostic mammography and breast tomosynthesis was
performed. The images were evaluated with computer-aided detection.;
Targeted ultrasound examination of the left breast was performed.

[L CC synth-2D]
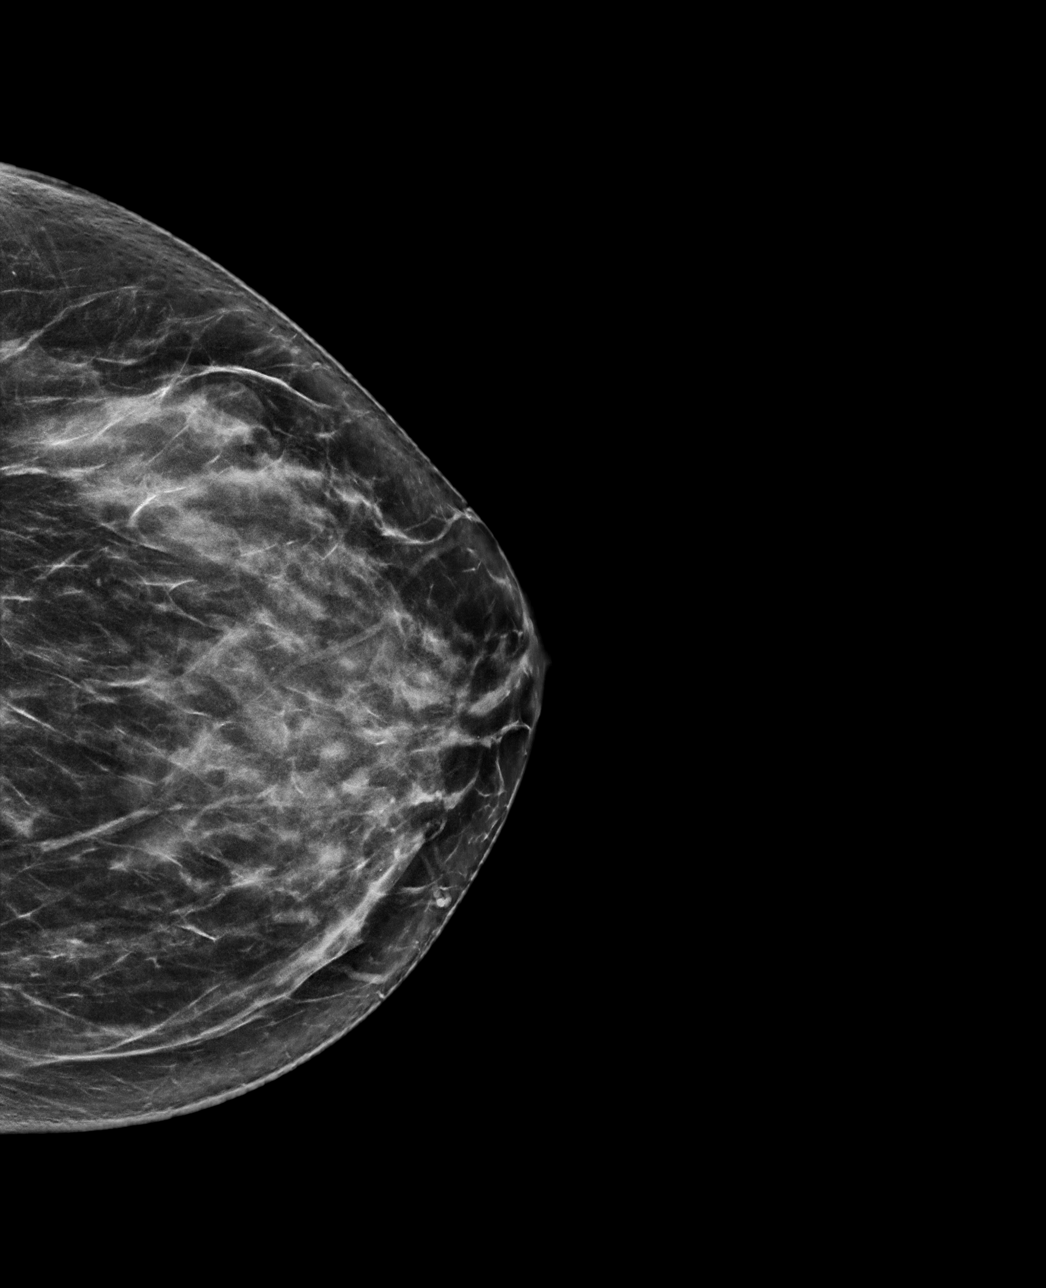

[L MLO synth-2D]
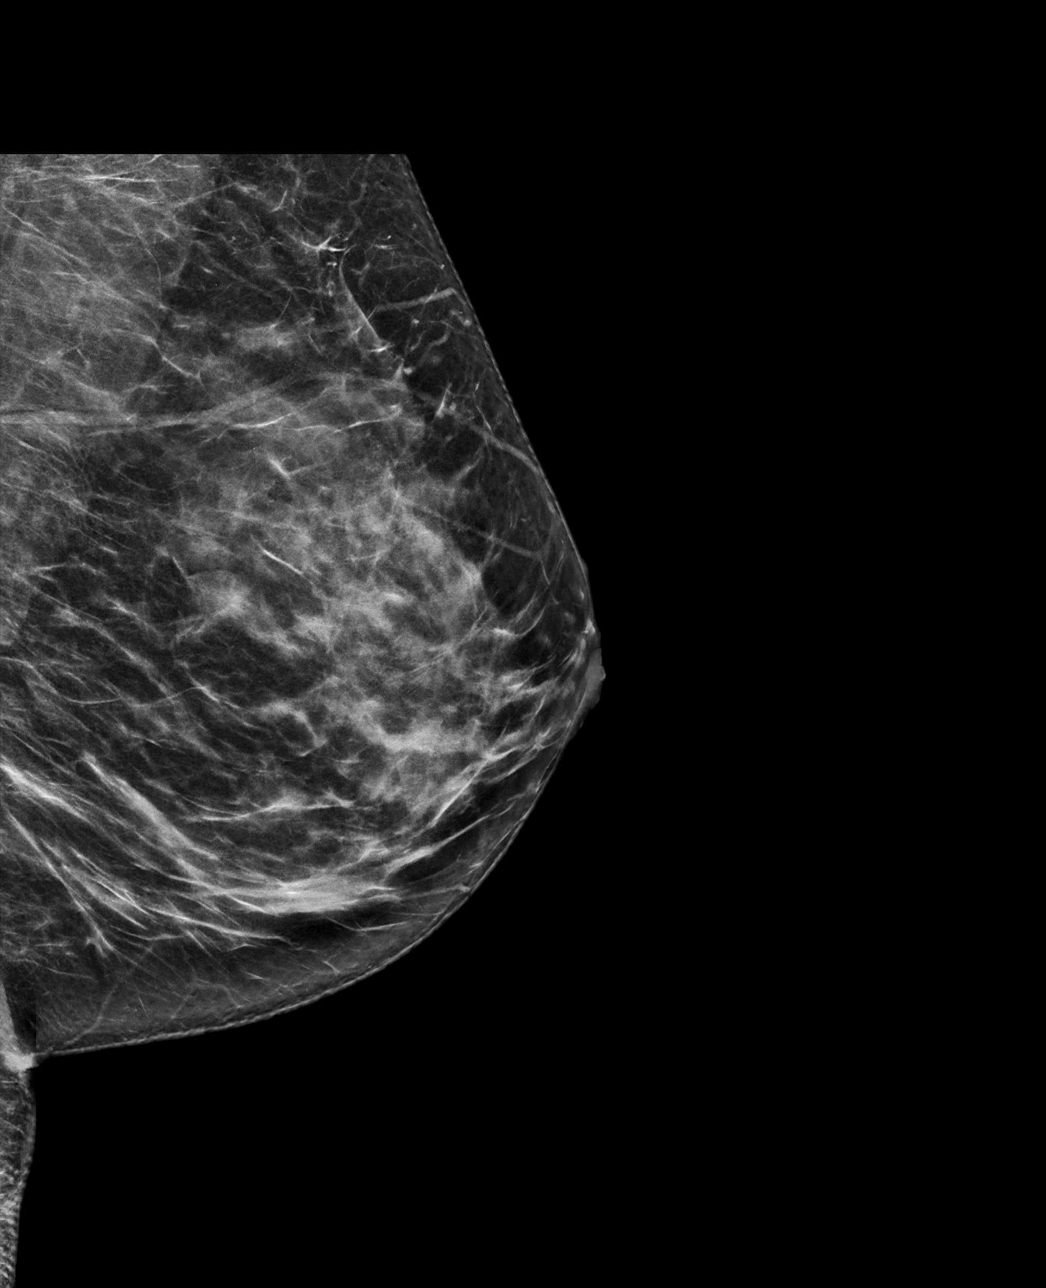

[L CC tomo · tomo slice 36/71.0]
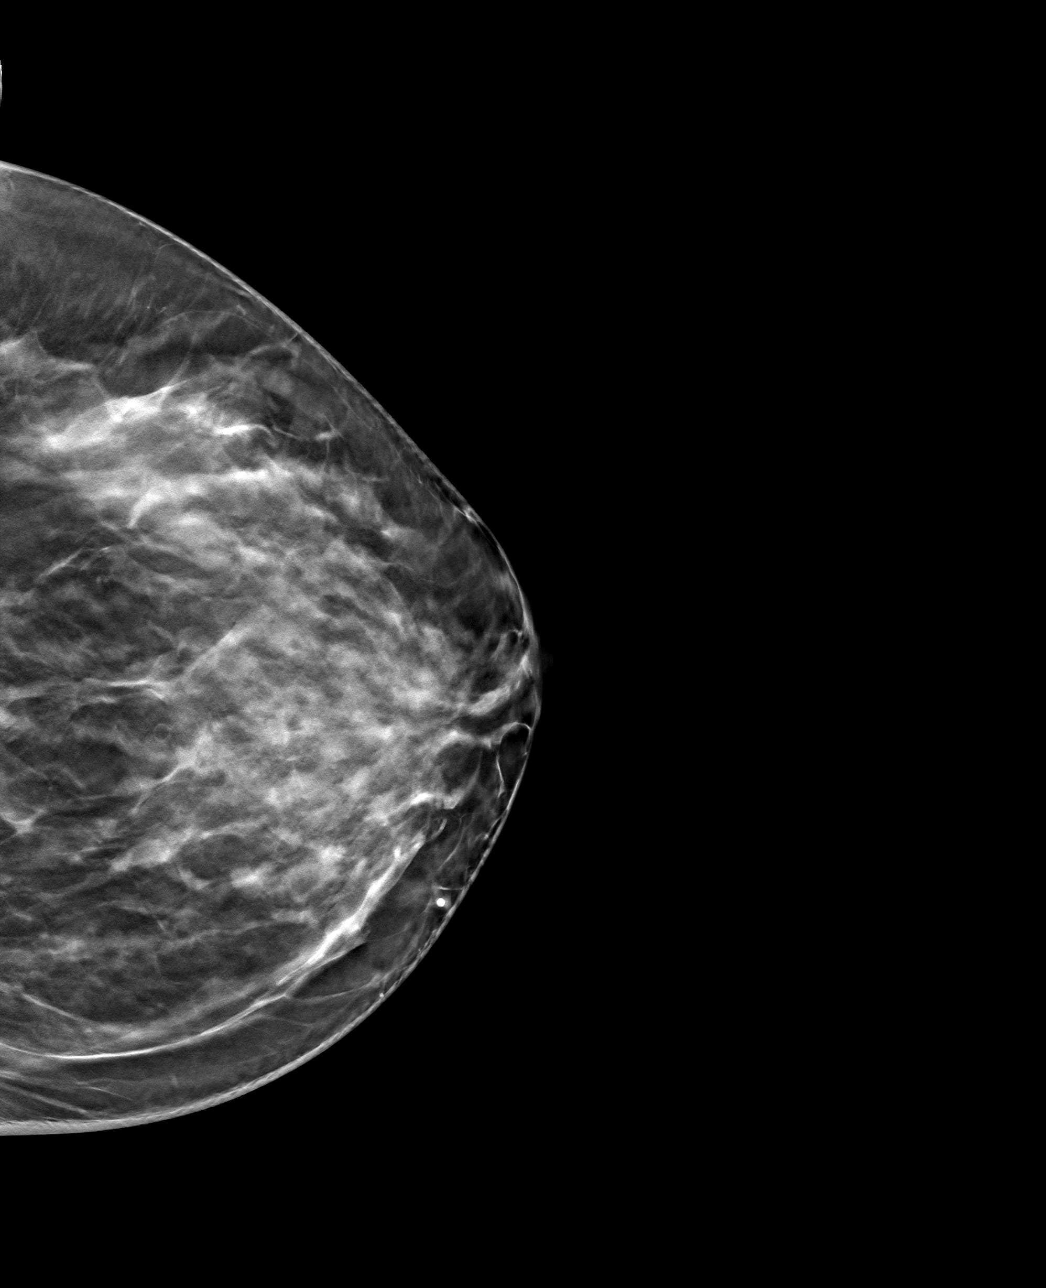

[L MLO tomo · tomo slice 36/71.0]
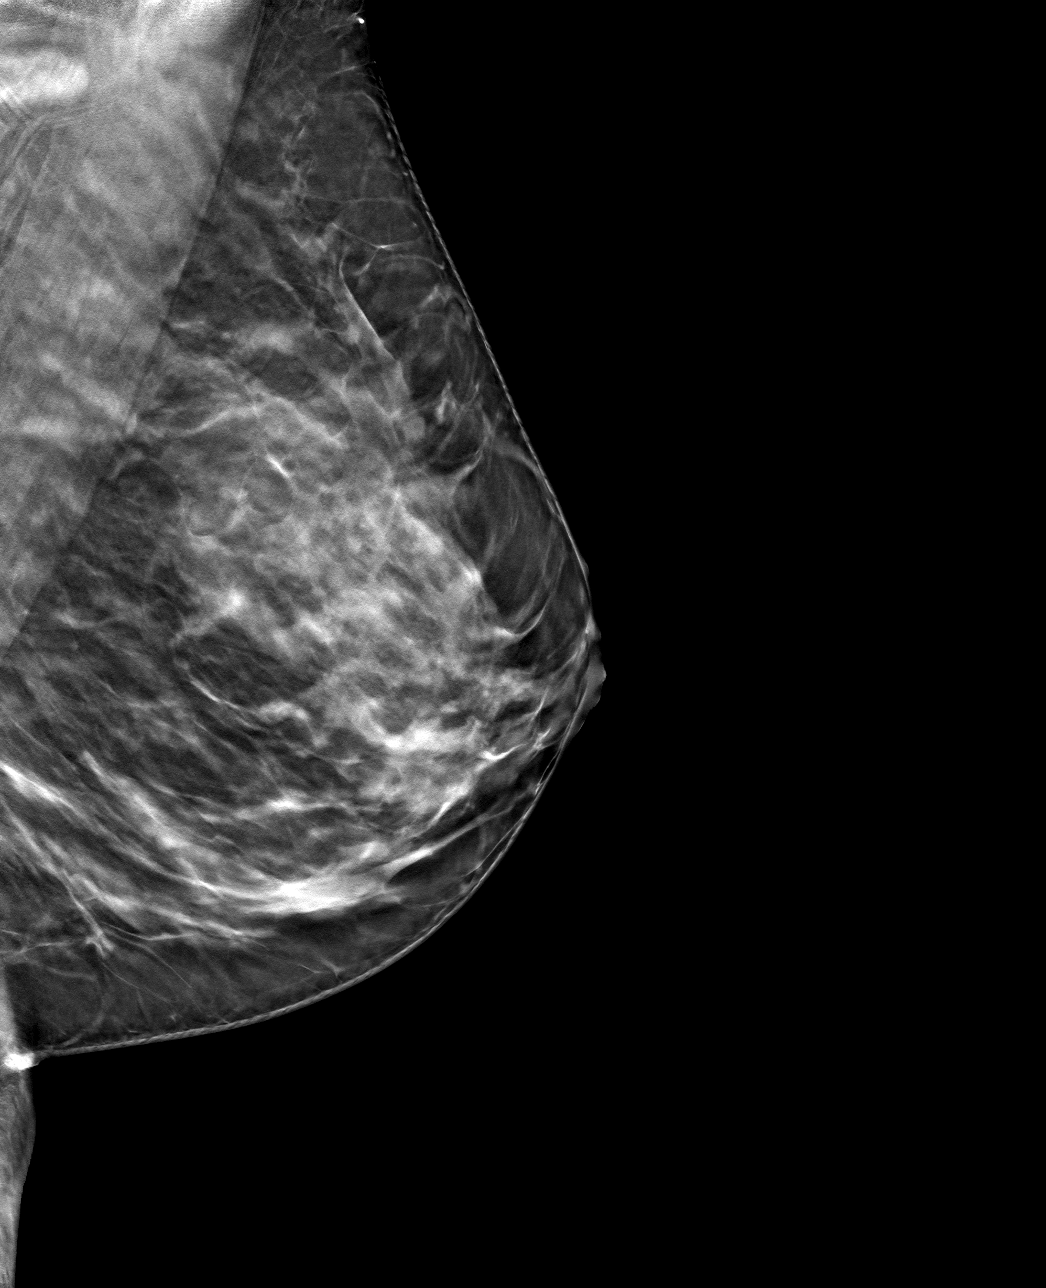

[4 of 12 positions shown; findings below may reference images not displayed]

ACR Breast Density Category c: The breast tissue is heterogeneously
dense, which may obscure small masses.
FINDINGS: The oval circumscribed mass within the outer LEFT breast appears
stable. There are no new dominant masses, suspicious calcifications
or secondary signs of malignancy identified within the LEFT breast

Targeted ultrasound is performed, again showing an oval
circumscribed hypoechoic mass in the LEFT breast at the 3 o'clock
axis, 6 cm from the nipple, measuring 1.7 x 0.8 x 1.7 cm, stable in
size and again most suggestive of a benign fibroadenoma.
IMPRESSION: Stable probably benign fibroadenoma in the LEFT breast at the 3
o'clock axis, 6 cm from the nipple, measuring 1.7 x 0.8 x 1.7 cm.
Recommend additional follow-up diagnostic exam in 6 months to ensure
continued stability.

RECOMMENDATION:
Bilateral diagnostic mammogram, and possible LEFT breast ultrasound,
in 6 months.

I have discussed the findings and recommendations with the patient.
If applicable, a reminder letter will be sent to the patient
regarding the next appointment.

BI-RADS CATEGORY  3: Probably benign.
